# Patient Record
Sex: Male | Born: 1965 | Race: White | Hispanic: No | Marital: Married | Smoking: Never smoker
Health system: Southern US, Community
[De-identification: ages and names within clinical notes are randomized; demographics above are authoritative.]

## PROBLEM LIST (undated history)

## (undated) DIAGNOSIS — K219 Gastro-esophageal reflux disease without esophagitis: Secondary | ICD-10-CM

## (undated) DIAGNOSIS — K5790 Diverticulosis of intestine, part unspecified, without perforation or abscess without bleeding: Secondary | ICD-10-CM

## (undated) DIAGNOSIS — I10 Essential (primary) hypertension: Secondary | ICD-10-CM

## (undated) DIAGNOSIS — F329 Major depressive disorder, single episode, unspecified: Secondary | ICD-10-CM

## (undated) DIAGNOSIS — F32A Depression, unspecified: Secondary | ICD-10-CM

## (undated) HISTORY — PX: ROTATOR CUFF REPAIR: SHX139

## (undated) HISTORY — PX: COLONOSCOPY: SHX174

---
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
## 2005-04-27 ENCOUNTER — Emergency Department: Payer: Self-pay | Admitting: Unknown Physician Specialty

## 2007-02-10 ENCOUNTER — Other Ambulatory Visit: Payer: Self-pay

## 2007-02-10 ENCOUNTER — Emergency Department: Payer: Self-pay | Admitting: Emergency Medicine

## 2008-05-13 IMAGING — CR DG CHEST 2V
1 series · 2 of 2 positions shown · non-contrast
Comparison: none

REASON FOR EXAM: chest pain
COMMENTS:

PROCEDURE:     DXR - DXR CHEST PA (OR AP) AND LATERAL  - February 10, 2007  [DATE]
RESULT:     The lungs are adequately inflated and clear. The heart is normal
in size. The pulmonary vascularity is not engorged. There is no pleural
effusion. The observed portions of the thoracic spine are normal.

[Series 1: view not recorded · 0.17mm/px · 2 of 2 slices shown]
[im 1/2]
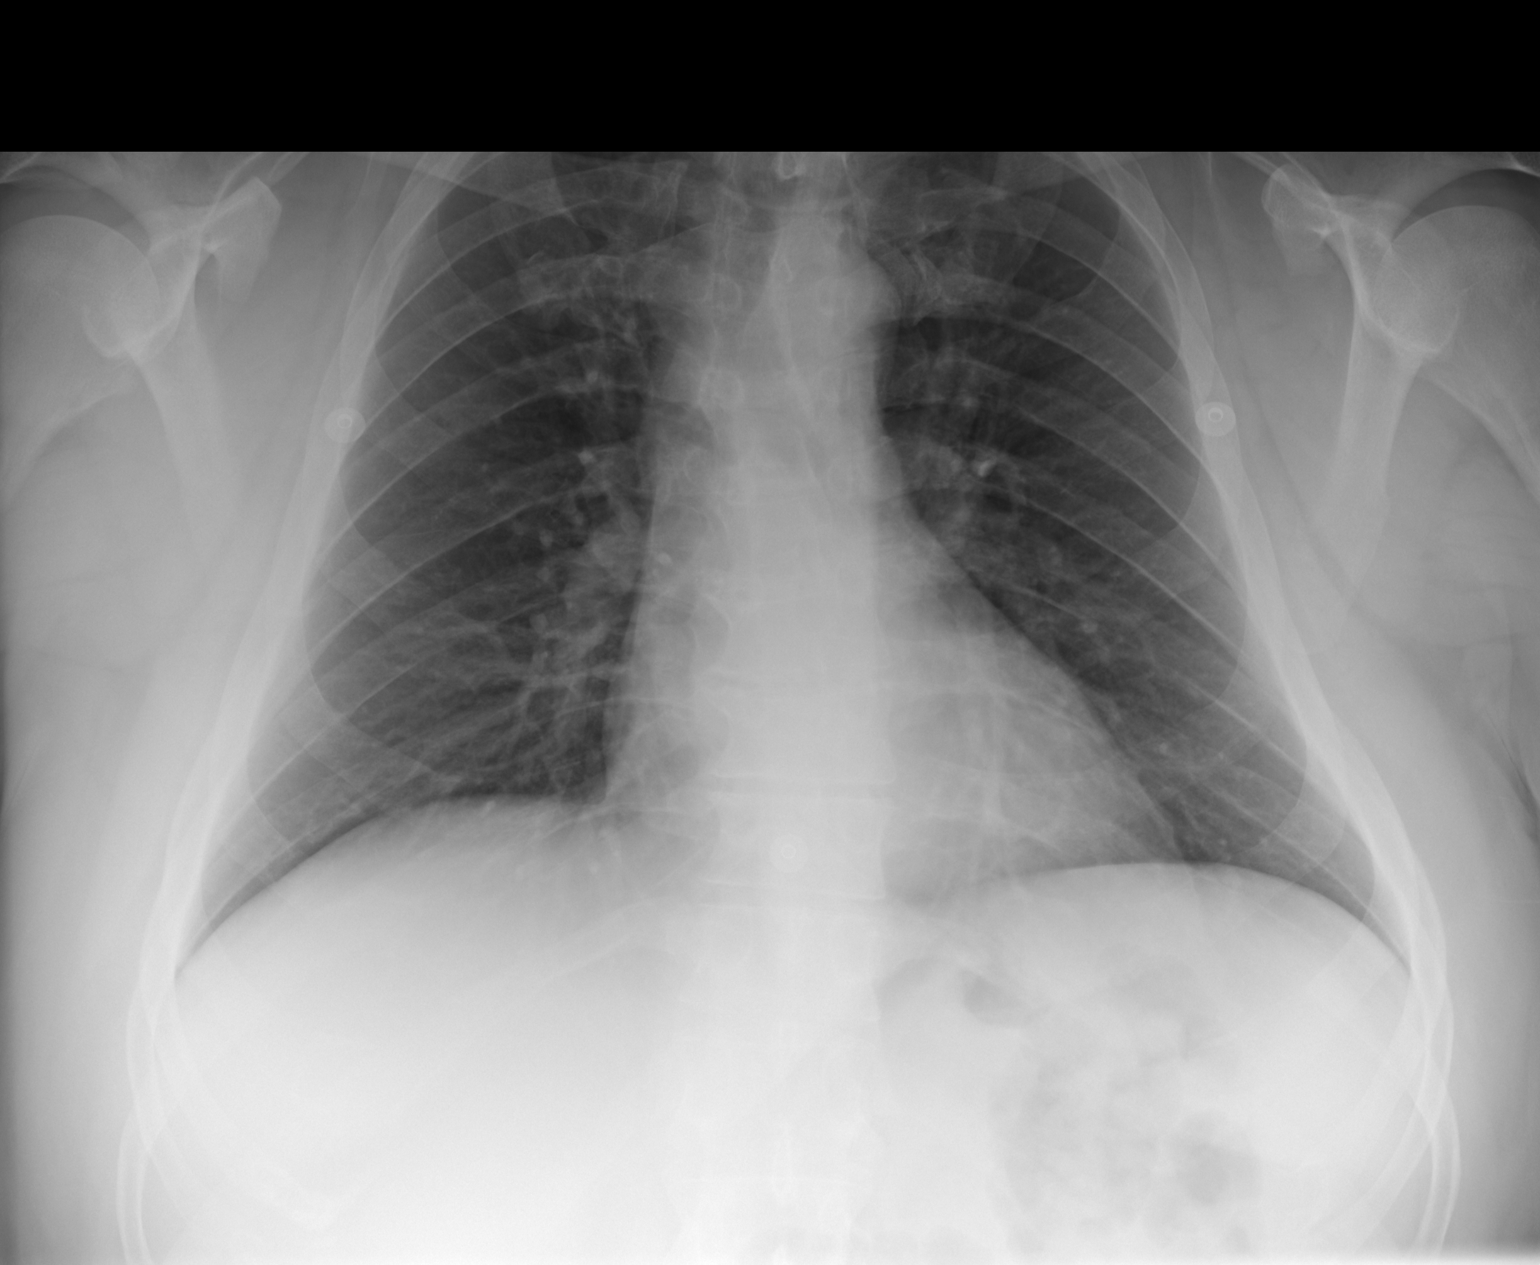
[im 2/2]
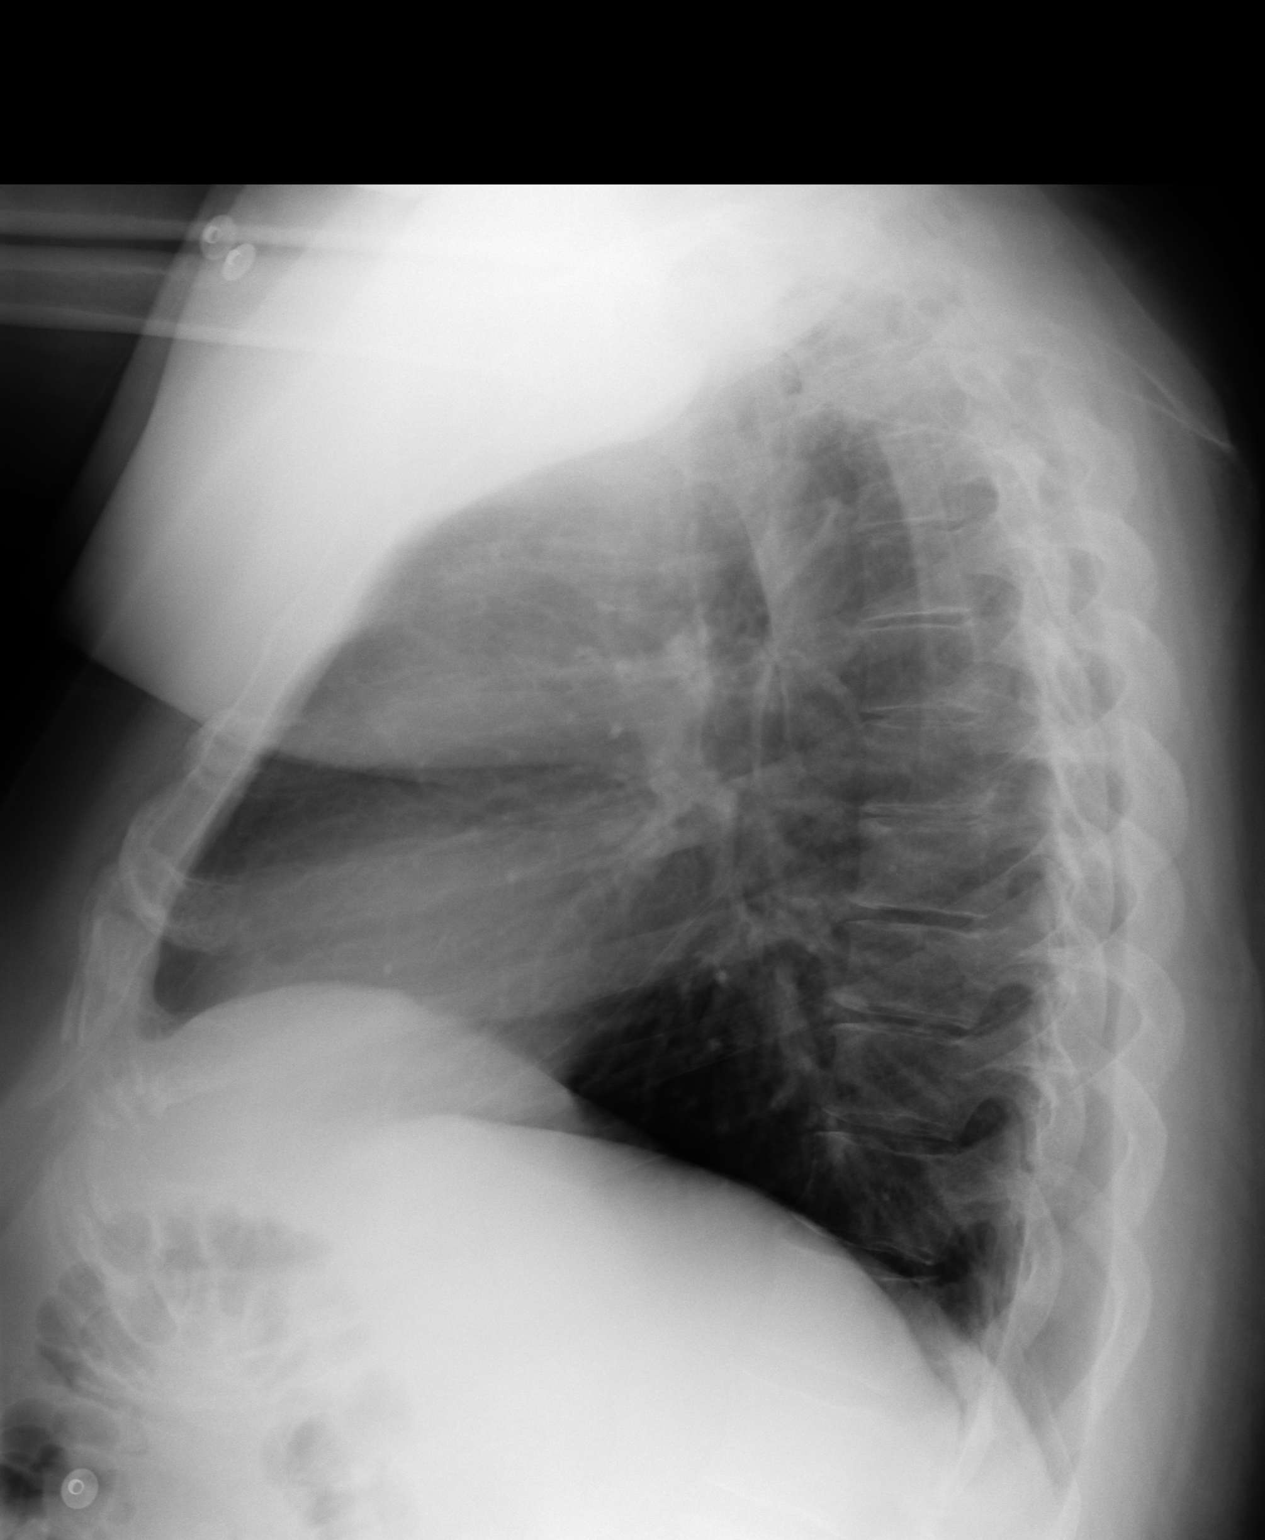

[2 of 2 positions shown; findings below may reference images not displayed]

IMPRESSION: I do not see evidence of acute cardiopulmonary disease. Followup imaging is
available if the patient's chest discomfort remains unexplained.

## 2008-09-24 ENCOUNTER — Ambulatory Visit: Payer: Self-pay | Admitting: Family Medicine

## 2009-12-26 IMAGING — RF DG BARIUM SWALLOW
1 series · 14 of 14 positions shown · non-contrast
Comparison: none

REASON FOR EXAM: hx GERD now w/regurgitation
COMMENTS:

PROCEDURE:     FL  - FL BARIUM SWALLOW  - September 24, 2008  [DATE]
RESULT:     Esophageal mucosal pattern and peristaltic activity and contour
are normal. No evidence of reflux. The cervical esophagus is normal.

[Series 1: run · 6 acquisitions, 14 frames shown]
[im 1/6]
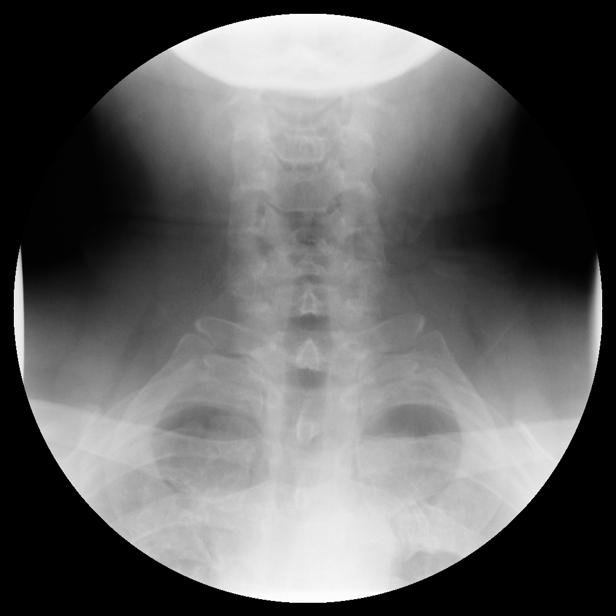
[im 1/6]
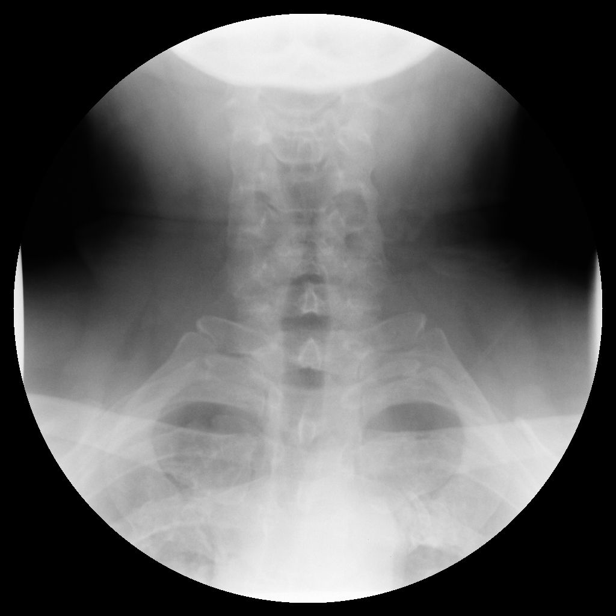
[im 1/6]
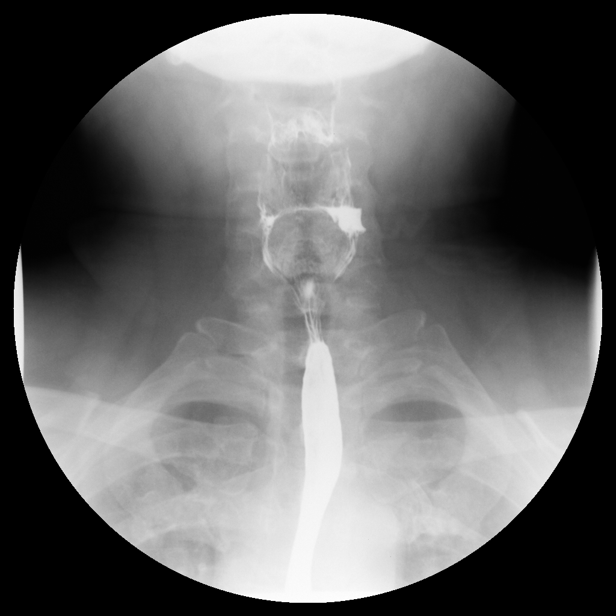
[im 1/6]
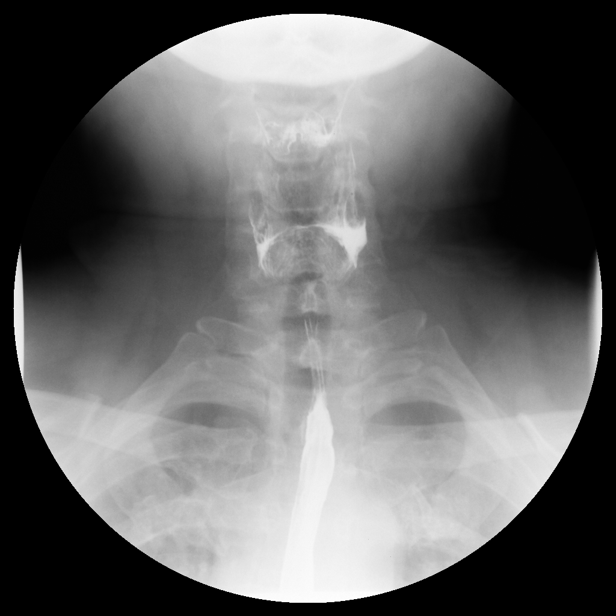
[im 2/6]
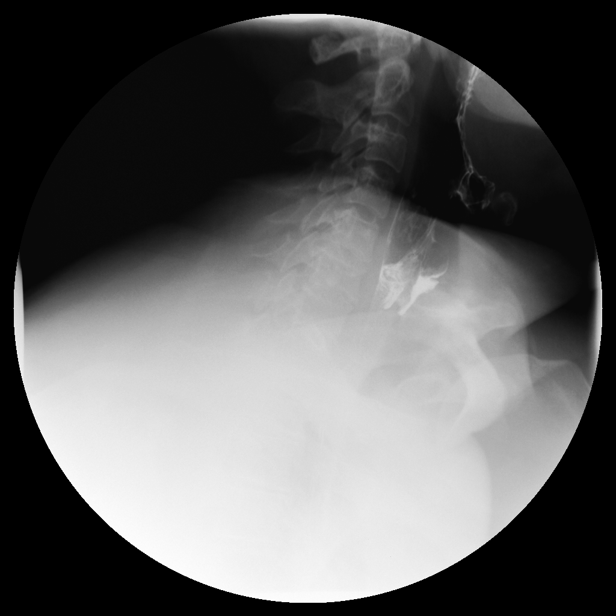
[im 2/6]
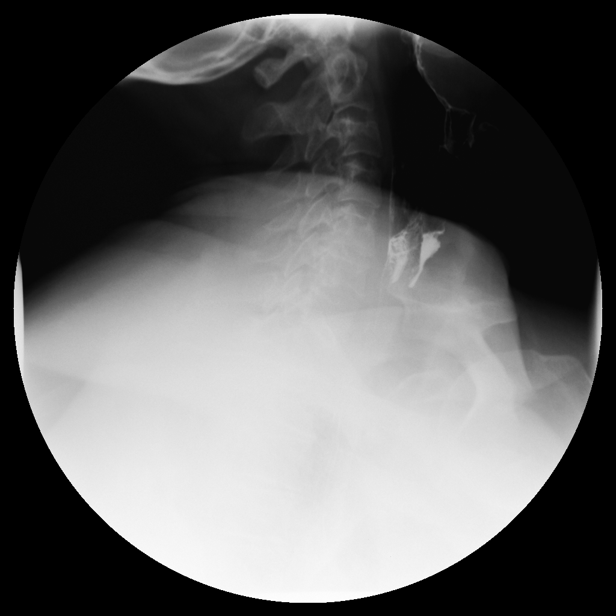
[im 2/6]
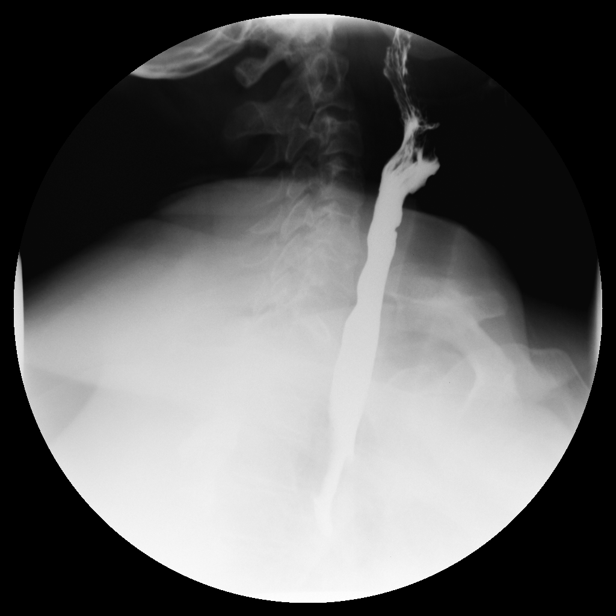
[im 2/6]
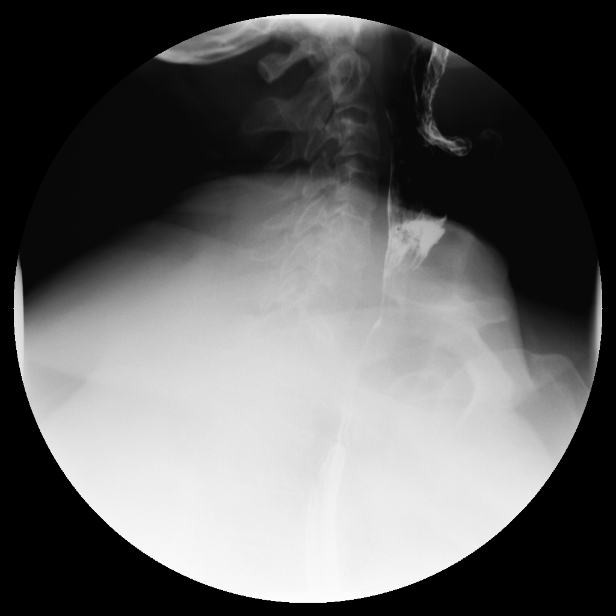
[im 3/6]
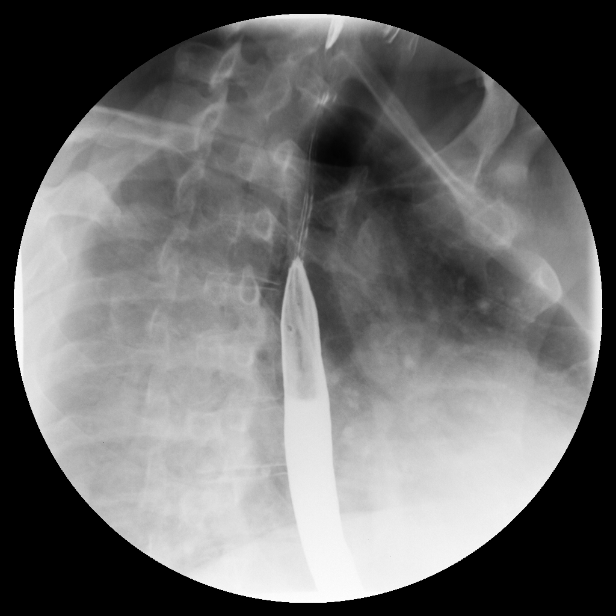
[im 3/6]
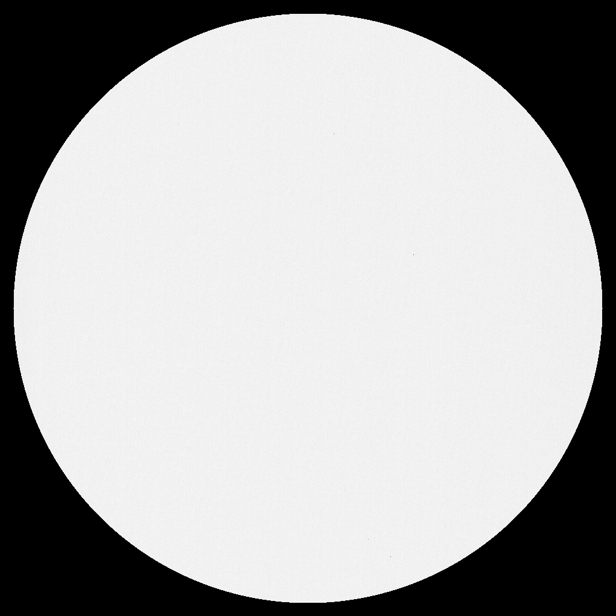
[im 4/6]
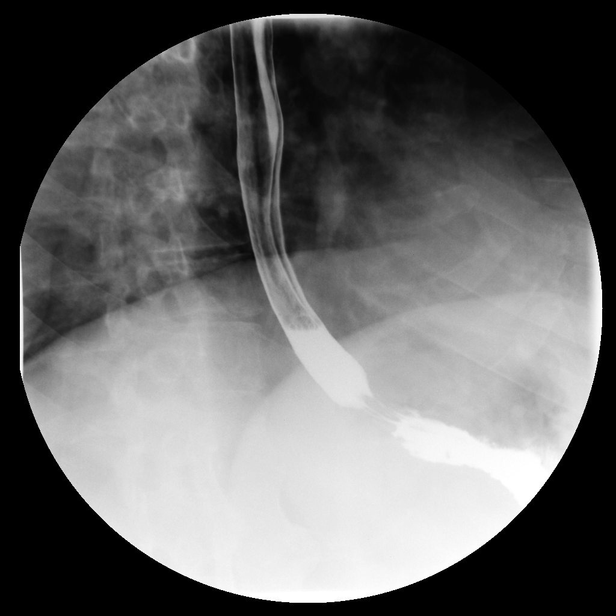
[im 5/6]
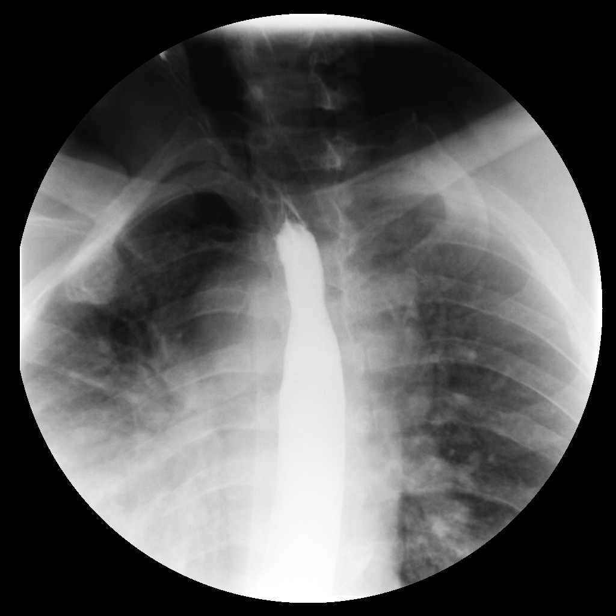
[im 6/6]
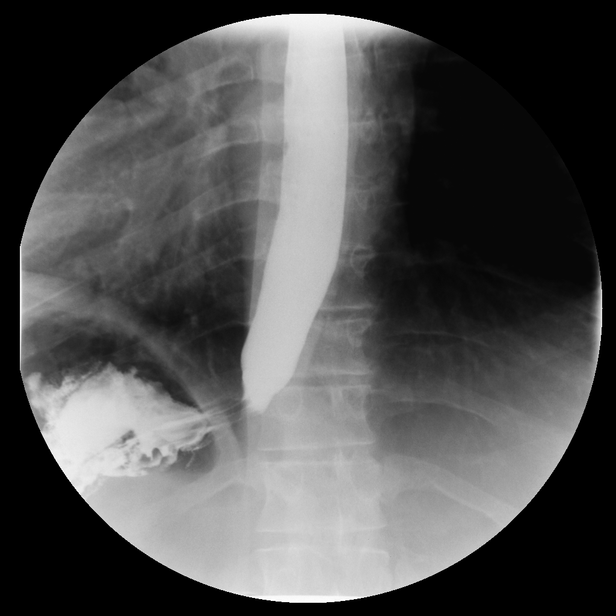
[im 6/6]
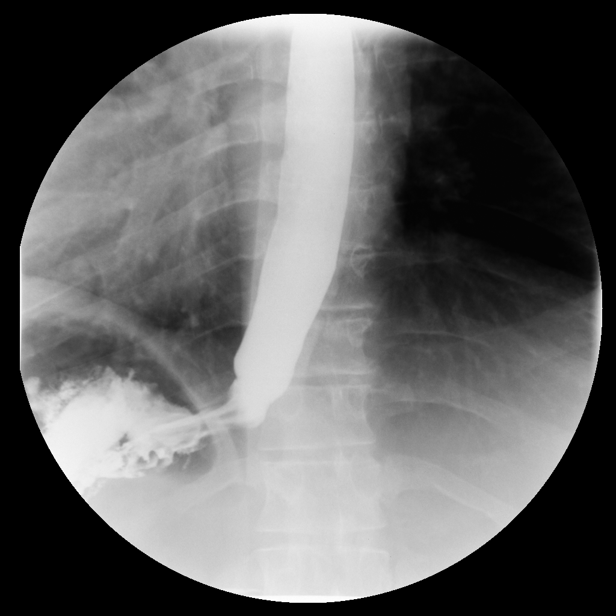

[14 of 14 positions shown; findings below may reference images not displayed]

IMPRESSION: 1. Normal exam.

## 2010-12-20 DIAGNOSIS — E669 Obesity, unspecified: Secondary | ICD-10-CM | POA: Insufficient documentation

## 2011-03-30 DIAGNOSIS — K2100 Gastro-esophageal reflux disease with esophagitis, without bleeding: Secondary | ICD-10-CM | POA: Insufficient documentation

## 2011-07-07 DIAGNOSIS — F32A Depression, unspecified: Secondary | ICD-10-CM | POA: Insufficient documentation

## 2012-10-25 ENCOUNTER — Ambulatory Visit: Payer: Self-pay | Admitting: Emergency Medicine

## 2012-10-25 LAB — RAPID STREP-A WITH REFLX: Micro Text Report: NEGATIVE

## 2012-10-27 LAB — BETA STREP CULTURE(ARMC)

## 2013-02-18 DIAGNOSIS — I10 Essential (primary) hypertension: Secondary | ICD-10-CM | POA: Insufficient documentation

## 2013-06-15 DIAGNOSIS — K5790 Diverticulosis of intestine, part unspecified, without perforation or abscess without bleeding: Secondary | ICD-10-CM | POA: Insufficient documentation

## 2013-08-24 ENCOUNTER — Ambulatory Visit: Payer: Self-pay | Admitting: Unknown Physician Specialty

## 2013-08-24 HISTORY — PX: ESOPHAGOGASTRODUODENOSCOPY: SHX1529

## 2013-09-01 LAB — PATHOLOGY REPORT

## 2014-11-10 ENCOUNTER — Ambulatory Visit
Admission: EM | Admit: 2014-11-10 | Discharge: 2014-11-10 | Disposition: A | Discharge status: Home / Self Care | Payer: Managed Care, Other (non HMO) | Attending: Family Medicine | Admitting: Family Medicine

## 2014-11-10 DIAGNOSIS — W57XXXA Bitten or stung by nonvenomous insect and other nonvenomous arthropods, initial encounter: Secondary | ICD-10-CM

## 2014-11-10 DIAGNOSIS — L02429 Furuncle of limb, unspecified: Secondary | ICD-10-CM | POA: Diagnosis not present

## 2014-11-10 DIAGNOSIS — S30861A Insect bite (nonvenomous) of abdominal wall, initial encounter: Secondary | ICD-10-CM

## 2014-11-10 MED ORDER — DOXYCYCLINE HYCLATE 100 MG PO CAPS: 100.0000 mg | ORAL_CAPSULE | Freq: Two times a day (BID) | ORAL | Status: AC

## 2014-11-10 NOTE — ED Notes (Signed)
Pt states he pulled a tick from his abdomen this morning and the head is still embedded.

## 2014-11-10 NOTE — ED Provider Notes (Signed)
Patient presents today with history of finding a tick on his lower abdomen. Patient states that he pulled the tick off but there is part of it that's remaining. He also has a red slightly tender area on his right lower extremity. He is unsure whether there was a tick there previously. He denies any fever, chills, myalgias, arthralgias, headache, nausea, vomiting. Patient states that he is outside often.   Review systems negative except mentioned above. Vitals as per chart.  GENERAL: NAD HEENT: no pharyngeal erythema, no exudate RESP: CTA B CARD: RRR SKIN: 5 mm area of circular erythema below navel, small black area in the center, minimally tender to palpation, dime sized erythematous slightly indurated area on right lateral aspect of leg, minimally tender, no discharge, no streaks NEURO: CN II-XII groslly intact   A/P: Tick bite/exposure, furuncle-splinter forceps were used to remove part of the tick that was remaining, patient tolerated procedure well, will treat patient with doxycycline for 7 days, keep area on right leg clean and dry, if any further problems patient is to follow-up with his primary care physician or our office. Encourage patient to do skin checks daily for ticks during the season.    Paulina Fusi, MD 11/10/14 [phone]

## 2015-11-15 DIAGNOSIS — N401 Enlarged prostate with lower urinary tract symptoms: Secondary | ICD-10-CM | POA: Insufficient documentation

## 2015-11-15 DIAGNOSIS — R972 Elevated prostate specific antigen [PSA]: Secondary | ICD-10-CM | POA: Insufficient documentation

## 2015-11-15 DIAGNOSIS — Z87442 Personal history of urinary calculi: Secondary | ICD-10-CM | POA: Insufficient documentation

## 2015-11-15 DIAGNOSIS — N138 Other obstructive and reflux uropathy: Secondary | ICD-10-CM | POA: Insufficient documentation

## 2015-12-21 DIAGNOSIS — B3789 Other sites of candidiasis: Secondary | ICD-10-CM | POA: Insufficient documentation

## 2017-05-14 DIAGNOSIS — K2950 Unspecified chronic gastritis without bleeding: Secondary | ICD-10-CM | POA: Insufficient documentation

## 2018-12-24 DIAGNOSIS — K760 Fatty (change of) liver, not elsewhere classified: Secondary | ICD-10-CM | POA: Insufficient documentation

## 2019-02-18 ENCOUNTER — Other Ambulatory Visit: Payer: Self-pay

## 2019-02-18 ENCOUNTER — Encounter
Admission: RE | Admit: 2019-02-18 | Discharge: 2019-02-18 | Disposition: A | Discharge status: Home / Self Care | Payer: Managed Care, Other (non HMO) | Source: Ambulatory Visit | Attending: Gastroenterology | Admitting: Gastroenterology

## 2019-02-18 DIAGNOSIS — Z01812 Encounter for preprocedural laboratory examination: Secondary | ICD-10-CM | POA: Diagnosis not present

## 2019-02-18 DIAGNOSIS — Z20828 Contact with and (suspected) exposure to other viral communicable diseases: Secondary | ICD-10-CM | POA: Diagnosis not present

## 2019-02-18 LAB — SARS CORONAVIRUS 2 (TAT 6-24 HRS): SARS Coronavirus 2: NEGATIVE

## 2019-02-22 ENCOUNTER — Encounter: Payer: Self-pay | Admitting: Anesthesiology

## 2019-02-23 ENCOUNTER — Encounter
Admission: RE | Disposition: A | Discharge status: Home / Self Care | Payer: Self-pay | Source: Home / Self Care | Attending: Gastroenterology

## 2019-02-23 ENCOUNTER — Encounter: Payer: Self-pay | Admitting: *Deleted

## 2019-02-23 ENCOUNTER — Other Ambulatory Visit: Payer: Self-pay

## 2019-02-23 ENCOUNTER — Ambulatory Visit: Payer: Managed Care, Other (non HMO) | Admitting: Anesthesiology

## 2019-02-23 ENCOUNTER — Ambulatory Visit
Admission: RE | Admit: 2019-02-23 | Discharge: 2019-02-23 | Disposition: A | Discharge status: Home / Self Care | Payer: Managed Care, Other (non HMO) | Attending: Gastroenterology | Admitting: Gastroenterology

## 2019-02-23 DIAGNOSIS — Z8719 Personal history of other diseases of the digestive system: Secondary | ICD-10-CM | POA: Diagnosis not present

## 2019-02-23 DIAGNOSIS — Z79899 Other long term (current) drug therapy: Secondary | ICD-10-CM | POA: Insufficient documentation

## 2019-02-23 DIAGNOSIS — F329 Major depressive disorder, single episode, unspecified: Secondary | ICD-10-CM | POA: Diagnosis not present

## 2019-02-23 DIAGNOSIS — I10 Essential (primary) hypertension: Secondary | ICD-10-CM | POA: Insufficient documentation

## 2019-02-23 DIAGNOSIS — Z6839 Body mass index (BMI) 39.0-39.9, adult: Secondary | ICD-10-CM | POA: Diagnosis not present

## 2019-02-23 DIAGNOSIS — Z800 Family history of malignant neoplasm of digestive organs: Secondary | ICD-10-CM | POA: Insufficient documentation

## 2019-02-23 DIAGNOSIS — Z881 Allergy status to other antibiotic agents status: Secondary | ICD-10-CM | POA: Insufficient documentation

## 2019-02-23 DIAGNOSIS — K5730 Diverticulosis of large intestine without perforation or abscess without bleeding: Secondary | ICD-10-CM | POA: Diagnosis not present

## 2019-02-23 DIAGNOSIS — D125 Benign neoplasm of sigmoid colon: Secondary | ICD-10-CM | POA: Insufficient documentation

## 2019-02-23 HISTORY — DX: Gastro-esophageal reflux disease without esophagitis: K21.9

## 2019-02-23 HISTORY — DX: Diverticulosis of intestine, part unspecified, without perforation or abscess without bleeding: K57.90

## 2019-02-23 HISTORY — DX: Essential (primary) hypertension: I10

## 2019-02-23 HISTORY — DX: Depression, unspecified: F32.A

## 2019-02-23 HISTORY — PX: COLONOSCOPY WITH PROPOFOL: SHX5780

## 2019-02-23 SURGERY — COLONOSCOPY WITH PROPOFOL
Anesthesia: General

## 2019-02-23 MED ORDER — LIDOCAINE HCL (PF) 2 % IJ SOLN
INTRAMUSCULAR | Status: DC | PRN
  Administered 2019-02-23: 100 mg

## 2019-02-23 MED ORDER — FENTANYL CITRATE (PF) 100 MCG/2ML IJ SOLN
INTRAMUSCULAR | Status: AC
  Filled 2019-02-23: qty 2

## 2019-02-23 MED ORDER — PROPOFOL 500 MG/50ML IV EMUL
INTRAVENOUS | Status: DC | PRN
  Administered 2019-02-23: 50 ug/kg/min via INTRAVENOUS

## 2019-02-23 MED ORDER — MIDAZOLAM HCL 5 MG/5ML IJ SOLN
INTRAMUSCULAR | Status: DC | PRN
  Administered 2019-02-23: 2 mg via INTRAVENOUS

## 2019-02-23 MED ORDER — LIDOCAINE HCL (PF) 2 % IJ SOLN
INTRAMUSCULAR | Status: AC
  Filled 2019-02-23: qty 10

## 2019-02-23 MED ORDER — MIDAZOLAM HCL 2 MG/2ML IJ SOLN
INTRAMUSCULAR | Status: AC
  Filled 2019-02-23: qty 2

## 2019-02-23 MED ORDER — PROPOFOL 500 MG/50ML IV EMUL
INTRAVENOUS | Status: AC
  Filled 2019-02-23: qty 50

## 2019-02-23 MED ORDER — FENTANYL CITRATE (PF) 100 MCG/2ML IJ SOLN
INTRAMUSCULAR | Status: DC | PRN
  Administered 2019-02-23 (×2): 50 ug via INTRAVENOUS

## 2019-02-23 MED ORDER — PROPOFOL 10 MG/ML IV BOLUS
INTRAVENOUS | Status: DC | PRN
  Administered 2019-02-23: 20 mg via INTRAVENOUS
  Administered 2019-02-23: 50 mg via INTRAVENOUS

## 2019-02-23 MED ORDER — SODIUM CHLORIDE 0.9 % IV SOLN
INTRAVENOUS | Status: DC
  Administered 2019-02-23: 11:00:00 via INTRAVENOUS

## 2019-02-23 NOTE — Transfer of Care (Signed)
Immediate Anesthesia Transfer of Care Note  Patient: Martin Johnson  Procedure(s) Performed: COLONOSCOPY WITH PROPOFOL (N/A )  Patient Location: PACU  Anesthesia Type:General  Level of Consciousness: sedated  Airway & Oxygen Therapy: Patient Spontanous Breathing and Patient connected to nasal cannula oxygen  Post-op Assessment: Report given to RN and Post -op Vital signs reviewed and stable  Post vital signs: Reviewed and stable  Last Vitals:  Vitals Value Taken Time  BP 134/86 02/23/19 1118  Temp 36.1 C 02/23/19 1119  Pulse 68 02/23/19 1119  Resp 16 02/23/19 1119  SpO2 99 % 02/23/19 1119  Vitals shown include unvalidated device data.  Last Pain:  Vitals:   02/23/19 1119  TempSrc:   PainSc: 0-No pain         Complications: No apparent anesthesia complications

## 2019-02-23 NOTE — Op Note (Signed)
[facility] Gastroenterology Patient Name: Martin Johnson Procedure Date: 02/23/2019 10:34 AM MRN: [id number] Account #: [id number] Date of Birth: 23-Apr-1966 Admit Type: Outpatient Age: 53 Room: [facility] ENDO ROOM 1 Gender: Male Note Status: Finalized Procedure:            Colonoscopy Indications:          Family history of colon cancer in a first-degree                        relative before age 33 years Providers:            Lollie Sails, MD Medicines:            Monitored Anesthesia Care Complications:        No immediate complications. Procedure:            Pre-Anesthesia Assessment:                       - ASA Grade Assessment: III - A patient with severe                        systemic disease.                       After obtaining informed consent, the colonoscope was                        passed under direct vision. Throughout the procedure,                        the patient's blood pressure, pulse, and oxygen                        saturations were monitored continuously. The                        Colonoscope was introduced through the anus and                        advanced to the the cecum, identified by appendiceal                        orifice and ileocecal valve. The colonoscopy was                        performed without difficulty. The patient tolerated the                        procedure well. The quality of the bowel preparation                        was fair. Findings:      A 3 mm polyp was found in the mid sigmoid colon. The polyp was sessile.       The polyp was removed with a cold snare. Resection and retrieval were       complete.      Many small and large-mouthed diverticula were found in the sigmoid       colon, descending colon, transverse colon and ascending colon.      The retroflexed view of the distal rectum and anal verge was normal and  showed no anal or rectal abnormalities.      The digital rectal exam was  normal. Impression:           - Preparation of the colon was fair.                       - One 3 mm polyp in the mid sigmoid colon, removed with                        a cold snare. Resected and retrieved.                       - Diverticulosis in the sigmoid colon, in the                        descending colon, in the transverse colon and in the                        ascending colon.                       - The distal rectum and anal verge are normal on                        retroflexion view. Recommendation:       - Discharge patient to home.                       - Soft diet today, then advance as tolerated to advance                        diet as tolerated. Procedure Code(s):    --- Professional ---                       [phone], Colonoscopy, flexible; with removal of tumor(s),                        polyp(s), or other lesion(s) by snare technique Diagnosis Code(s):    --- Professional ---                       K63.5, Polyp of colon                       Z80.0, Family history of malignant neoplasm of                        digestive organs                       K57.30, Diverticulosis of large intestine without                        perforation or abscess without bleeding CPT copyright 2019 American Medical Association. All rights reserved. The codes documented in this report are preliminary and upon coder review may  be revised to meet current compliance requirements. Lollie Sails, MD 02/23/2019 11:17:39 AM This report has been signed electronically. Number of Addenda: 0 Note Initiated On: 02/23/2019 10:34 AM Scope Withdrawal Time: 0 hours 12 minutes 42 seconds  Total Procedure Duration: 0 hours 21 minutes 41 seconds  [facility]

## 2019-02-23 NOTE — H&P (Signed)
Outpatient short stay form Pre-procedure 02/23/2019 10:43 AM Martin Sails MD  Primary Physician: Silvio Pate, PA  Reason for visit: Colonoscopy  History of present illness: Patient is a 53 year old male presenting today for colonoscopy in regards to his family history of colon cancer primary relative, father.  He was also originally scheduled for EGD however has declined that procedure today.  Does have a history of diverticulitis as well.  His last EGD and colonoscopy were on 08/24/2013 showing diverticulosis and hyperplastic polyp in the colon and some grade a esophagitis likely reflux related.  Patient denies any upper GI symptoms including heartburn or dysphagia.  Tolerated his prep well.  He takes no aspirin or blood thinning agent.    Current Facility-Administered Medications:  .  0.9 %  sodium chloride infusion, , Intravenous, Continuous, Martin Sails, MD, Last Rate: 20 mL/hr at 02/23/19 1039  Medications Prior to Admission  Medication Sig Dispense Refill Last Dose  . citalopram (CELEXA) 20 MG tablet Take 20 mg by mouth daily.   02/21/2019 at 2100  . fluticasone (FLONASE) 50 MCG/ACT nasal spray Place into both nostrils 2 (two) times daily as needed for allergies or rhinitis.   02/21/2019 at 2100     Allergies  Allergen Reactions  . Azithromycin Diarrhea     Past Medical History:  Diagnosis Date  . Depression   . Diverticulosis   . GERD (gastroesophageal reflux disease)   . Hypertension     Review of systems:      Physical Exam    Heart and lungs: Regular rate and rhythm without rub or gallop lungs are bilaterally clear    HEENT: Normocephalic atraumatic eyes are anicteric    Other:    Pertinant exam for procedure: Soft nontender nondistended bowel sounds positive normoactive colonoscopy and indicated procedures.    Planned proceedures: Colonoscopy and indicated procedures. I have discussed the risks benefits and complications of procedures to include  not limited to bleeding, infection, perforation and the risk of sedation and the patient wishes to proceed.    Martin Sails, MD Gastroenterology 02/23/2019  10:43 AM

## 2019-02-23 NOTE — Anesthesia Postprocedure Evaluation (Signed)
Anesthesia Post Note  Patient: Martin Johnson  Procedure(s) Performed: COLONOSCOPY WITH PROPOFOL (N/A )  Patient location during evaluation: Endoscopy Anesthesia Type: General Level of consciousness: awake and alert Pain management: pain level controlled Vital Signs Assessment: post-procedure vital signs reviewed and stable Respiratory status: spontaneous breathing, nonlabored ventilation, respiratory function stable and patient connected to nasal cannula oxygen Cardiovascular status: blood pressure returned to baseline and stable Postop Assessment: no apparent nausea or vomiting Anesthetic complications: no     Last Vitals:  Vitals:   02/23/19 1126 02/23/19 1129  BP:  (!) 137/91  Pulse: 72   Resp: 20   Temp:    SpO2: 100%     Last Pain:  Vitals:   02/23/19 1135  TempSrc:   PainSc: 0-No pain                 Jeffery Bachmeier S

## 2019-02-23 NOTE — Anesthesia Preprocedure Evaluation (Signed)
Anesthesia Evaluation  Patient identified by MRN, date of birth, ID band Patient awake    Reviewed: Allergy & Precautions, NPO status , Patient's Chart, lab work & pertinent test results, reviewed documented beta blocker date and time   Airway Mallampati: III  TM Distance: >3 FB     Dental  (+) Chipped   Pulmonary           Cardiovascular      Neuro/Psych    GI/Hepatic   Endo/Other  Morbid obesity  Renal/GU      Musculoskeletal   Abdominal   Peds  Hematology   Anesthesia Other Findings   Reproductive/Obstetrics                             Anesthesia Physical Anesthesia Plan  ASA: III  Anesthesia Plan: General   Post-op Pain Management:    Induction: Intravenous  PONV Risk Score and Plan:   Airway Management Planned:   Additional Equipment:   Intra-op Plan:   Post-operative Plan:   Informed Consent: I have reviewed the patients History and Physical, chart, labs and discussed the procedure including the risks, benefits and alternatives for the proposed anesthesia with the patient or authorized representative who has indicated his/her understanding and acceptance.       Plan Discussed with: CRNA  Anesthesia Plan Comments:         Anesthesia Quick Evaluation

## 2019-02-23 NOTE — Anesthesia Post-op Follow-up Note (Signed)
Anesthesia QCDR form completed.        

## 2019-02-24 ENCOUNTER — Encounter: Payer: Self-pay | Admitting: Gastroenterology

## 2019-02-24 LAB — SURGICAL PATHOLOGY

## 2019-08-09 DIAGNOSIS — G4733 Obstructive sleep apnea (adult) (pediatric): Secondary | ICD-10-CM | POA: Insufficient documentation

## 2020-07-24 NOTE — Progress Notes (Signed)
Cardiology Office Note  Date:  07/26/2020   ID:  Taggert, Bozzi November 12, 1965, MRN [id number]  PCP:  Midge Minium, PA   Chief Complaint  Patient presents with  . New Patient (Initial Visit)    Establish care with provider for chest pain and BBB. Medications verbally reviewed with patient.     HPI:  Mr. Martin Johnson is a 55 year old gentleman with past medical history of Severe OSA, uses CPAP HTN Diverticulitis atypical chest pain Seen in the ER for chest pain 07/10/2020,  Who presents by referral  from Monico Hoar for consultation of his chest pain  Seen in the emergency room July 10, 2020 at [facility] 2 days of intermittent, moderate parasternal chest pain associated with right arm coldness/tingling sensation. Patient reports nothing like this is ever happened before. No significant leg edema. No dyspnea. Patient not having chest pain upon time of initial evaluation. Patient reports no history of DVT/PE, no cancer history, no smoking, no recent surgery/immobilization.  Atypical pain, off and on, Hurts to touch it BP elevated in ER, started on low dose benicar  Does not check BP at home  Tingling hand/arm on right, Works on machinery  RBBB, seen before  CPAP mask, does not fit well, leaks during the night  EKG personally reviewed by myself on todays visit nsr rate 83 RBBB  Total chol 183, LDL 118   PMH:   has a past medical history of Depression, Diverticulosis, GERD (gastroesophageal reflux disease), and Hypertension.  PSH:    Past Surgical History:  Procedure Laterality Date  . COLONOSCOPY  08/24/2013, 12/02/2009   hyperplastic polyps ub rectum  . COLONOSCOPY WITH PROPOFOL N/A 02/23/2019   Procedure: COLONOSCOPY WITH PROPOFOL;  Surgeon: Lollie Sails, MD;  Location: [facility] ENDOSCOPY;  Service: Endoscopy;  Laterality: N/A;  . ESOPHAGOGASTRODUODENOSCOPY  08/24/2013   reflus gastroerophagistis, chronic gastritis in the body and antrum & single colon  polyp in antrum.    Current Outpatient Medications  Medication Sig Dispense Refill  . fluticasone (FLONASE) 50 MCG/ACT nasal spray Place into both nostrils 2 (two) times daily as needed for allergies or rhinitis.    Marland Kitchen olmesartan (BENICAR) 5 MG tablet Take 1 tablet by mouth daily.    Marland Kitchen omeprazole (PRILOSEC) 40 MG capsule Take 1 tablet by mouth daily.     No current facility-administered medications for this visit.     Allergies:   Azithromycin   Social History:  The patient  reports that he has never smoked. He has never used smokeless tobacco. He reports that he does not drink alcohol and does not use drugs.   Family History:   family history includes Cancer in his mother; Colon cancer in his father.    Review of Systems: Review of Systems  Constitutional: Negative.   HENT: Negative.   Respiratory: Negative.   Cardiovascular: Negative.        Pain on pushing on right chest  Gastrointestinal: Negative.   Musculoskeletal: Negative.   Neurological: Negative.   Psychiatric/Behavioral: Negative.   All other systems reviewed and are negative.   PHYSICAL EXAM: VS:  BP 140/90 (BP Location: Right Arm, Patient Position: Sitting, Cuff Size: Normal)   Pulse 83   Ht 5\' 8"  (1.727 m)   Wt 278 lb (126.1 kg)   SpO2 97%   BMI 42.27 kg/m  , BMI Body mass index is 42.27 kg/m. GEN: Well nourished, well developed, in no acute distress HEENT: normal Neck: no JVD, carotid bruits, or masses Cardiac:  RRR; no murmurs, rubs, or gallops,no edema  Respiratory:  clear to auscultation bilaterally, normal work of breathing GI: soft, nontender, nondistended, + BS MS: no deformity or atrophy Skin: warm and dry, no rash Neuro:  Strength and sensation are intact Psych: euthymic mood, full affect   Recent Labs: No results found for requested labs within last 8760 hours.    Lipid Panel No results found for: CHOL, HDL, LDLCALC, TRIG    Wt Readings from Last 3 Encounters:  07/26/20 278 lb  (126.1 kg)  02/23/19 260 lb (117.9 kg)  11/10/14 250 lb (113.4 kg)      ASSESSMENT AND PLAN:  Problem List Items Addressed This Visit   None   Visit Diagnoses    Chest pain of uncertain etiology    -  Primary   Obstructive sleep apnea         Atypical chest pain Musculoskeletal Rib issue No further cardiac work needed CT coronary calcium score ordered  Morbid obesity We have encouraged continued exercise, careful diet management in an effort to lose weight.  OSA on CPAP Mask does not seal well  HTN Blood pressure is well controlled on today's visit. No changes made to the medications.  RBBB: No further workup needed    Total encounter time more than 60 minutes  Greater than 50% was spent in counseling and coordination of care with the patient  Patient seen in consultation for Silvio Pate and will be referred back to her office for ongoing care of the issues detailed above    Signed, Esmond Plants, M.D., Ph.D. [place], [place]

## 2020-07-26 ENCOUNTER — Other Ambulatory Visit: Payer: Self-pay

## 2020-07-26 ENCOUNTER — Encounter: Payer: Self-pay | Admitting: Cardiovascular Disease

## 2020-07-26 ENCOUNTER — Ambulatory Visit: Payer: Managed Care, Other (non HMO) | Admitting: Cardiovascular Disease

## 2020-07-26 VITALS — BP 140/90 | HR 83 | Ht 68.0 in | Wt 278.0 lb

## 2020-07-26 DIAGNOSIS — Z8249 Family history of ischemic heart disease and other diseases of the circulatory system: Secondary | ICD-10-CM

## 2020-07-26 DIAGNOSIS — R079 Chest pain, unspecified: Secondary | ICD-10-CM | POA: Diagnosis not present

## 2020-07-26 DIAGNOSIS — G4733 Obstructive sleep apnea (adult) (pediatric): Secondary | ICD-10-CM | POA: Diagnosis not present

## 2020-07-26 NOTE — Patient Instructions (Addendum)
Medication Instructions:  No changes  Lab work: No new labs needed  Testing/Procedures: CT coronary calcium score: (family history)  $99 out of pocket expense  at our [employer] in [place]  This procedure uses special x-ray equipment to produce pictures of the coronary arteries to determine if they are blocked or narrowed by the buildup of plaque - an indicator for atherosclerosis or coronary artery disease (CAD).  Please call [phone] to schedule at your earliest convince   [place] [address] [place], [place] [address]   Follow-Up:  . You will need a follow up appointment as needed  . Providers on your designated Care Team:   . Murray Hodgkins, NP . Christell Faith, PA-C . Marrianne Mood, PA-C .

## 2020-08-05 ENCOUNTER — Ambulatory Visit
Admission: RE | Admit: 2020-08-05 | Discharge: 2020-08-05 | Disposition: A | Discharge status: Home / Self Care | Payer: Managed Care, Other (non HMO) | Source: Ambulatory Visit | Attending: Cardiovascular Disease | Admitting: Cardiovascular Disease

## 2020-08-05 ENCOUNTER — Other Ambulatory Visit: Payer: Self-pay

## 2020-08-05 DIAGNOSIS — R079 Chest pain, unspecified: Secondary | ICD-10-CM | POA: Insufficient documentation

## 2020-08-05 DIAGNOSIS — Z8249 Family history of ischemic heart disease and other diseases of the circulatory system: Secondary | ICD-10-CM | POA: Insufficient documentation

## 2020-09-20 ENCOUNTER — Telehealth: Payer: Self-pay | Admitting: Cardiovascular Disease

## 2020-09-20 NOTE — Telephone Encounter (Signed)
Pt c/o BP issue: STAT if pt c/o blurred vision, one-sided weakness or slurred speech  1. What are your last 5 BP readings? 170/108 manually  And w. Machine 141/101.  2. Are you having any other symptoms (ex. Dizziness, headache, blurred vision, passed out)? No  3. What is your BP issue? High and raised concern at company health check today

## 2020-09-20 NOTE — Telephone Encounter (Signed)
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 Martin Johnson reports he had been up working piror to BP readings and did take his Benicar 5 mg this morning. Denies any associated symptoms, reports he feels fine, no CP, shob, or dizziness.  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 If still remains elevated, then to call back for possible appt to re-evaluate BP and possible medication changes. Pt verbalized understanding, also advised to call back if HTN and starts to have associated cardiac symptoms. Pt verbalized understanding, grateful for the return call, will monitor BP and call back with any further concerns.

## 2022-06-25 ENCOUNTER — Ambulatory Visit: Payer: Managed Care, Other (non HMO)

## 2022-06-25 ENCOUNTER — Ambulatory Visit: Payer: Managed Care, Other (non HMO) | Attending: Medical | Admitting: Medical

## 2022-06-25 ENCOUNTER — Encounter: Payer: Self-pay | Admitting: Medical

## 2022-06-25 ENCOUNTER — Other Ambulatory Visit
Admission: RE | Admit: 2022-06-25 | Discharge: 2022-06-25 | Disposition: A | Discharge status: Home / Self Care | Payer: Managed Care, Other (non HMO) | Source: Ambulatory Visit | Attending: Medical | Admitting: Medical

## 2022-06-25 VITALS — BP 128/90 | HR 98 | Wt 288.8 lb

## 2022-06-25 DIAGNOSIS — G4733 Obstructive sleep apnea (adult) (pediatric): Secondary | ICD-10-CM

## 2022-06-25 DIAGNOSIS — I10 Essential (primary) hypertension: Secondary | ICD-10-CM

## 2022-06-25 DIAGNOSIS — R002 Palpitations: Secondary | ICD-10-CM

## 2022-06-25 DIAGNOSIS — Z01810 Encounter for preprocedural cardiovascular examination: Secondary | ICD-10-CM

## 2022-06-25 DIAGNOSIS — R079 Chest pain, unspecified: Secondary | ICD-10-CM

## 2022-06-25 LAB — TSH: TSH: 1.244 u[IU]/mL (ref 0.350–4.500)

## 2022-06-25 NOTE — Progress Notes (Signed)
Cardiology Office Note:    Date:  06/25/2022   ID:  Martin Johnson, DOB Mar 15, 1966, MRN [id number]  PCP:  Midge Minium, [facility] Cardiologist:  None  [facility] Electrophysiologist:  None   Referring MD: Midge Minium, PA   Chief Complaint: 1 year follow-up/pre-op  History of Present Illness:    Martin Johnson is a 57 y.o. male with a hx of severe OSA, CPAP, HTN, diverticulitis, atypical chest pain who presents for 1 year follow-up.   The patient was seen in the ER at [facility] 07/2020 for chest pain. Work-up showed elevated BP and he was started on Benicar. Cardiac CT was ordered, which showed coronary calcium score of 0.   Today, the patient reports last Thursday he went to th ER for for possible kidney stone. He was place don antibiotic for UTI. He had a virtual appointment and thought he possible had a flu and medicine was sent in. He felt his heart rate was high and was sweating. IT didn't last too long. BP 150/70 and HR 98bpm. Sunday he felt the same way and checked his vitals, 176/104 pulse 89bpm. He didn't feel lightheaded or sweaty He went back to the ER and was diagnosed with Flu B.He was given IVF. He was told he may have an abnormal heart beat.   From the flue he fee tired weak, some SOB. He had minimal chest pain. He had a lot of acid-reflux the last couple days.   He was supposed to go for rotator cuff surgery, postoned until February.   Past Medical History:  Diagnosis Date   Depression    Diverticulosis    GERD (gastroesophageal reflux disease)    Hypertension     Past Surgical History:  Procedure Laterality Date   COLONOSCOPY  08/24/2013, 12/02/2009   hyperplastic polyps ub rectum   COLONOSCOPY WITH PROPOFOL N/A 02/23/2019   Procedure: COLONOSCOPY WITH PROPOFOL;  Surgeon: Lollie Sails, MD;  Location: [facility] ENDOSCOPY;  Service: Endoscopy;  Laterality: N/A;   ESOPHAGOGASTRODUODENOSCOPY  08/24/2013   reflus gastroerophagistis, chronic  gastritis in the body and antrum & single colon polyp in antrum.    Current Medications: Current Meds  Medication Sig   cefUROXime (CEFTIN) 500 MG tablet Take 500 mg by mouth 2 (two) times daily with a meal.   fluticasone (FLONASE) 50 MCG/ACT nasal spray Place into both nostrils every morning.   olmesartan (BENICAR) 5 MG tablet Take 1 tablet by mouth daily.   omeprazole (PRILOSEC) 40 MG capsule Take 1 tablet by mouth daily.   rosuvastatin (CRESTOR) 5 MG tablet Take 1 tablet by mouth daily.     Allergies:   Azithromycin   Social History   Socioeconomic History   Marital status: Married    Spouse name: Not on file   Number of children: Not on file   Years of education: Not on file   Highest education level: Not on file  Occupational History   Not on file  Tobacco Use   Smoking status: Never   Smokeless tobacco: Never  Substance and Sexual Activity   Alcohol use: No   Drug use: No   Sexual activity: Yes  Other Topics Concern   Not on file  Social History Narrative   Not on file   Social Determinants of Health   Financial Resource Strain: Not on file  Food Insecurity: Not on file  Transportation Needs: Not on file  Physical Activity: Not on file  Stress: Not  on file  Social Connections: Not on file     Family History: The patient's family history includes Cancer in his mother; Colon cancer in his father.  ROS:   Please see the history of present illness.     All other systems reviewed and are negative.  EKGs/Labs/Other Studies Reviewed:    The following studies were reviewed today:  Cardiac CTA 08/2020 IMPRESSION: Normal coronary calcium score of 0. Patient is low risk for coronary events.   Aaron Edelman Agbor-Etang    EKG:  EKG is ordered today.  The ekg ordered today demonstrates NSR 66bpm, no significant ST/T wave changes  Recent Labs: 06/25/2022: TSH 1.244  Recent Lipid Panel No results found for: "CHOL", "TRIG", "HDL", "CHOLHDL", "VLDL", "LDLCALC",  "LDLDIRECT"  Physical Exam:    VS:  BP (!) 128/90 (BP Location: Left Arm, Patient Position: Sitting, Cuff Size: Normal)   Pulse 98   Wt 288 lb 12.8 oz (131 kg)   SpO2 97%   BMI 43.91 kg/m     Wt Readings from Last 3 Encounters:  06/25/22 288 lb 12.8 oz (131 kg)  07/26/20 278 lb (126.1 kg)  02/23/19 260 lb (117.9 kg)     GEN:  Well nourished, well developed in no acute distress HEENT: Normal NECK: No JVD; No carotid bruits LYMPHATICS: No lymphadenopathy CARDIAC: RRR, no murmurs, rubs, gallops RESPIRATORY:  Clear to auscultation without rales, wheezing or rhonchi  ABDOMEN: Soft, non-tender, non-distended MUSCULOSKELETAL:  No edema; No deformity  SKIN: Warm and dry NEUROLOGIC:  Alert and oriented x 3 PSYCHIATRIC:  Normal affect   ASSESSMENT:    1. Palpitations   2. Chest pain of uncertain etiology   3. Obstructive sleep apnea   4. Essential hypertension   5. Pre-operative cardiovascular examination    PLAN:    In order of problems listed above:  Palpitations Patient reports abnormal heartbeat and elevated blood pressure in the setting of flu and UTI.  Patient reports heart rates in the upper 90s.  EKG today shows sinus rhythm, right bundle branch block with a heart rate of 98 bpm.  Patient is slowly recovering from the flu, however he still feels tired and weak with minimal shortness of breath. Recent labs are unremarkable. I will order TSH. I will also order a 2-week heart monitor. I will see him back in 6-8 weeks to assess symptoms. I suspect this is most likely secondary to flu/UTI.  Atypical chest pain Patient reports atypical sharp chest pain, both on the left and the right sides.  EKG today with no ischemic changes.  Chest pain in the setting of flu.  Prior cardiac scoring was 0.  No further ischemic workup at this time.  OSA on CPAP He not using his CPAP I will refer him back to pulmonology to discuss management.  HTN He reported elevated BP in the setting of  UTI and flu.  BP today is normal, 128/90.  The patient takes olmesartan 5 mg daily.  We will assess this at follow-up.  RBBB Stable, no further workup.  Pre-op cardiac evaluation for rotator cuff repair Patient was previously scheduled for left rotator cuff repair this month, however in the setting of Flu/UTI, the surgery has been postponed until late February.  As above, the patient reports palpitations in the setting of flu/UTI.  I will order a heart monitor and TSH have above. I suspect symptoms are exacerbated by acute illness. If this shows no significant abnormalities, okay to proceed with surgery.  Patient reports  occasional atypical chest pain, he had had prior cardiac score in 2022 of 0,  which is overall reassuring.  Blood pressure and heart rate are good today. EKG with no changes.  METs greater than 4.  According to revised cardiac risk index patient is class I risk, 3.9% for 30-day risk of death, MI or cardiac arrest.  Disposition: Follow up in 6-8 week(s) with MD/APP     Signed, Kipling Graser Ninfa Meeker, PA-C  06/25/2022 10:33 AM    [place]

## 2022-06-25 NOTE — Patient Instructions (Addendum)
Medication Instructions:  - Your physician recommends that you continue on your current medications as directed. Please refer to the Current Medication list given to you today.  *If you need a refill on your cardiac medications before your next appointment, please call your pharmacy*   Lab Work: - Your physician recommends that you have lab work today:  [place] [facility] at [facility] 1st desk on the right to check in (REGISTRATION)  Lab hours: Monday- Friday (7:30 am- 5:30 pm)   If you have labs (blood work) drawn today and your tests are completely normal, you will receive your results only by: MyChart Message (if you have MyChart) OR A paper copy in the mail If you have any lab test that is abnormal or we need to change your treatment, we will call you to review the results.   Testing/Procedures: 1) You have been referred to : [facility] for management of sleep apnea.  Their office will call you to set up an appointment. If you do not hear from them in the next 1-2 weeks, please call [phone] to follow up.    2) Heart Monitor:  Length of Wear: 14 days  Your monitor will be mailed to your home address within 3-5 business days. However, if you have not received your monitor after 5 business days please send [place] a MyChart message or call the office at [phone], so we may follow up on this for you.   Your physician has recommended that you wear a Zio XT (heart)  monitor.   This monitor is a medical device that records the heart's electrical activity. Doctors most often use these monitors to diagnose arrhythmias. Arrhythmias are problems with the speed or rhythm of the heartbeat. The monitor is a small device applied to your chest. You can wear one while you do your normal daily activities. While wearing this monitor if you have any symptoms to push the button and record what you felt. Once you have worn this monitor for the period of time provider prescribed  (Usually 14 days), you will return the monitor device in the postage paid box. Once it is returned they will download the data collected and provide [place] with a report which the provider will then review and we will call you with those results. Important tips:  Avoid showering during the first 24 hours of wearing the monitor. Avoid excessive sweating to help maximize wear time. Do not submerge the device, no hot tubs, and no swimming pools. Keep any lotions or oils away from the patch. After 24 hours you may shower with the patch on. Take brief showers with your back facing the shower head.  Do not remove patch once it has been placed because that will interrupt data and decrease adhesive wear time. Push the button when you have any symptoms and write down what you were feeling. Once you have completed wearing your monitor, remove and place into box which has postage paid and place in your outgoing mailbox.  If for some reason you have misplaced your box then call our office and we can provide another box and/or mail it off for you.       Follow-Up: At [facility], you and your health needs are our priority.  As part of our continuing mission to provide you with exceptional heart care, we have created designated Provider Care Teams.  These Care Teams include your primary Cardiologist (physician) and Advanced Practice Providers (APPs -  Physician Assistants and Nurse Practitioners) who all work together to provide you with the care you need, when you need it.  We recommend signing up for the patient portal called "MyChart".  Sign up information is provided on this After Visit Summary.  MyChart is used to connect with patients for Virtual Visits (Telemedicine).  Patients are able to view lab/test results, encounter notes, upcoming appointments, etc.  Non-urgent messages can be sent to your provider as well.   To learn more about what you can do with MyChart, go to [email or web address].     Your next appointment:   6-8 week(s)  Provider:   You may see Ida Rogue, MD or one of the following Advanced Practice Providers on your designated Care Team:    Cadence Kathlen Mody, [place]   Other Instructions N/a

## 2022-07-01 DIAGNOSIS — R002 Palpitations: Secondary | ICD-10-CM

## 2022-07-20 ENCOUNTER — Ambulatory Visit: Payer: Managed Care, Other (non HMO) | Admitting: Primary Care

## 2022-07-20 ENCOUNTER — Encounter: Payer: Self-pay | Admitting: Primary Care

## 2022-07-20 VITALS — BP 122/80 | HR 84 | Temp 97.1°F | Ht 68.0 in | Wt 291.6 lb

## 2022-07-20 DIAGNOSIS — G4733 Obstructive sleep apnea (adult) (pediatric): Secondary | ICD-10-CM

## 2022-07-20 DIAGNOSIS — G4730 Sleep apnea, unspecified: Secondary | ICD-10-CM | POA: Diagnosis not present

## 2022-07-20 NOTE — Patient Instructions (Signed)
  Orders: CPAP titration study  Follow-up: 3 months with Eustaquio Maize NP

## 2022-07-20 NOTE — Progress Notes (Unsigned)
$@Patientc$  ID: [name], male    DOB: 1965-11-11, 57 y.o.   MRN: [id number]  No chief complaint on file.   Referring provider: Antony Madura, PA-C  HPI: 57 year old male, never smoked. PMH significant HTN, OSA, GERD, hepatic steatosis, obesity.  07/20/2022 Patient presents today for sleep consult. He was referred by cardiology d/t palpitations. Hx sleep apnea, not currently using CPAP. He had sleep study on 08/07/2019 that showed severe sleep apnea, average AHI 40/hr. He still has machine. He has tried several different masks but could not find one that worked well for him.  Previous DME company was in [place]. He still has symptoms of snoring. He feels he sleeps alright at night. Typical bed time is 9pm. It takes him <5 mins to fall asleep. Wakes up on average once to use the restroom. Starts his at 3am. Not currently wearing CPAP or oxygen. He sleeps in a recliner. Epworth score 5.    Allergies  Allergen Reactions   Azithromycin Diarrhea    Immunization History  Administered Date(s) Administered   Moderna Sars-Covid-2 Vaccination 02/03/2020    Past Medical History:  Diagnosis Date   Depression    Diverticulosis    GERD (gastroesophageal reflux disease)    Hypertension     Tobacco History: Social History   Tobacco Use  Smoking Status Never  Smokeless Tobacco Never   Counseling given: Not Answered   Outpatient Medications Prior to Visit  Medication Sig Dispense Refill   fluticasone (FLONASE) 50 MCG/ACT nasal spray Place into both nostrils every morning.     olmesartan (BENICAR) 5 MG tablet Take 1 tablet by mouth daily.     omeprazole (PRILOSEC) 40 MG capsule Take 1 tablet by mouth daily.     rosuvastatin (CRESTOR) 5 MG tablet Take 1 tablet by mouth daily.     No facility-administered medications prior to visit.      Review of Systems  Review of Systems  Constitutional: Negative.   HENT: Negative.    Respiratory: Negative.    Cardiovascular: Negative.       Physical Exam  There were no vitals taken for this visit. Physical Exam Constitutional:      Appearance: Normal appearance.  HENT:     Head: Normocephalic and atraumatic.     Mouth/Throat:     Mouth: Mucous membranes are moist.     Pharynx: Oropharynx is clear.  Cardiovascular:     Rate and Rhythm: Normal rate and regular rhythm.  Pulmonary:     Effort: Pulmonary effort is normal.     Breath sounds: Normal breath sounds.  Musculoskeletal:        General: Normal range of motion.  Skin:    General: Skin is warm and dry.  Neurological:     General: No focal deficit present.     Mental Status: He is alert and oriented to person, place, and time. Mental status is at baseline.  Psychiatric:        Mood and Affect: Mood normal.        Behavior: Behavior normal.        Thought Content: Thought content normal.        Judgment: Judgment normal.      Lab Results:  CBC No results found for: "WBC", "RBC", "HGB", "HCT", "PLT", "MCV", "MCH", "MCHC", "RDW", "LYMPHSABS", "MONOABS", "EOSABS", "BASOSABS"  BMET No results found for: "NA", "K", "CL", "CO2", "GLUCOSE", "BUN", "CREATININE", "CALCIUM", "GFRNONAA", "GFRAA"  BNP No results found for: "BNP"  ProBNP No  results found for: "PROBNP"  Imaging: No results found.   Assessment & Plan:   No problem-specific Assessment & Plan notes found for this encounter.     Martyn Ehrich, NP 07/20/2022

## 2022-07-23 NOTE — Assessment & Plan Note (Signed)
-   Hx sleep apnea, not currently on CPAP. He had sleep study on 08/07/2019 that showed severe sleep apnea, average AHI 40/hr. He continues to have symptoms snoring along with palpitations. He has two working CPAP machines, he has tried several masks but could not find one that worked. Needs CPAP titration study. FU after testing to review results/ establish with DME company for supplies.

## 2022-08-08 ENCOUNTER — Encounter: Payer: Self-pay | Admitting: Medical

## 2022-08-08 ENCOUNTER — Ambulatory Visit: Payer: Managed Care, Other (non HMO) | Attending: Medical | Admitting: Medical

## 2022-08-08 VITALS — BP 156/90 | HR 86 | Ht 68.0 in | Wt 290.0 lb

## 2022-08-08 DIAGNOSIS — R002 Palpitations: Secondary | ICD-10-CM

## 2022-08-08 DIAGNOSIS — I455 Other specified heart block: Secondary | ICD-10-CM

## 2022-08-08 DIAGNOSIS — G4733 Obstructive sleep apnea (adult) (pediatric): Secondary | ICD-10-CM | POA: Diagnosis not present

## 2022-08-08 DIAGNOSIS — I472 Ventricular tachycardia, unspecified: Secondary | ICD-10-CM

## 2022-08-08 DIAGNOSIS — I10 Essential (primary) hypertension: Secondary | ICD-10-CM

## 2022-08-08 NOTE — Patient Instructions (Signed)
Medication Instructions:   Your physician recommends that you continue on your current medications as directed. Please refer to the Current Medication list given to you today.  *If you need a refill on your cardiac medications before your next appointment, please call your pharmacy*   Lab Work:  1. None Ordered  If you have labs (blood work) drawn today and your tests are completely normal, you will receive your results only by: MyChart Message (if you have MyChart) OR A paper copy in the mail If you have any lab test that is abnormal or we need to change your treatment, we will call you to review the results.   Testing/Procedures:  Your physician has requested that you have an echocardiogram. Echocardiography is a painless test that uses sound waves to create images of your heart. It provides your doctor with information about the size and shape of your heart and how well your heart's chambers and valves are working. This procedure takes approximately one hour. There are no restrictions for this procedure. Please do NOT wear cologne, perfume, aftershave, or lotions (deodorant is allowed). Please arrive 15 minutes prior to your appointment time.    Follow-Up: At [facility], you and your health needs are our priority.  As part of our continuing mission to provide you with exceptional heart care, we have created designated Provider Care Teams.  These Care Teams include your primary Cardiologist (physician) and Advanced Practice Providers (APPs -  Physician Assistants and Nurse Practitioners) who all work together to provide you with the care you need, when you need it.  We recommend signing up for the patient portal called "MyChart".  Sign up information is provided on this After Visit Summary.  MyChart is used to connect with patients for Virtual Visits (Telemedicine).  Patients are able to view lab/test results, encounter notes, upcoming appointments, etc.  Non-urgent messages  can be sent to your provider as well.   To learn more about what you can do with MyChart, go to [email or web address].    Your next appointment:   2 month(s)  Provider:   You may see Ida Rogue, MD or one of the following Advanced Practice Providers on your designated Care Team:    Cadence [place], [place]

## 2022-08-08 NOTE — Progress Notes (Unsigned)
ELECTROPHYSIOLOGY CONSULT NOTE  Patient ID: Martin Johnson, MRN: [id number], DOB/AGE: Jul 29, 1965 57 y.o. Admit date: (Not on file) Date of Consult: 08/09/2022  Primary Physician: Martin Minium, PA Primary Cardiologist: Martin Johnson is a 57 y.o. male who is being seen today for the evaluation of VT at the request of CF-PA.    HPI Martin Johnson is a 57 y.o. male seen in consult for VT nonsustained  assoc with palps And also pauses detected on his monitor   Mid January, had 2 days where his heart was palpitating associate with diaphoresis and lightheadedness.  Lasted the first time about 5 hours went to bed the next morning persisted.  He was seen at the [facility] ER in [facility].  ECG data were reviewed which demonstrated frequent and consecutive PVCs, morphology was not described  Interestingly, he was diagnosed with flu and a "UTI "the latter following hematuria with clots.>> urologist consult.  Sleeping in the chair.  Denies edema.  Mild dyspnea on exertion.  No chest discomfort    DATE TEST EF   2/22 CTA   CaScore 0        Date Cr K Hgb  1/24 1.06 3.8 14.7         Untreated severe OSA failed to get fitted well, is now being followed by [facility]    Past Medical History:  Diagnosis Date   Depression    Diverticulosis    GERD (gastroesophageal reflux disease)    Hypertension       Surgical History:  Past Surgical History:  Procedure Laterality Date   COLONOSCOPY  08/24/2013, 12/02/2009   hyperplastic polyps ub rectum   COLONOSCOPY WITH PROPOFOL N/A 02/23/2019   Procedure: COLONOSCOPY WITH PROPOFOL;  Surgeon: Lollie Sails, MD;  Location: [facility] ENDOSCOPY;  Service: Endoscopy;  Laterality: N/A;   ESOPHAGOGASTRODUODENOSCOPY  08/24/2013   reflus gastroerophagistis, chronic gastritis in the body and antrum & single colon polyp in antrum.   ROTATOR CUFF REPAIR Left      Home Meds: Current Meds  Medication Sig   atenolol  (TENORMIN) 50 MG tablet Take 1 tablet (50 mg total) by mouth 2 (two) times daily.   bisoprolol (ZEBETA) 5 MG tablet Take 1 tablet (5 mg total) by mouth daily.   fluticasone (FLONASE) 50 MCG/ACT nasal spray Place into both nostrils every morning.   metoprolol tartrate (LOPRESSOR) 50 MG tablet Take 1 tablet (50 mg total) by mouth 2 (two) times daily.   omeprazole (PRILOSEC) 40 MG capsule Take 1 tablet by mouth daily.   rosuvastatin (CRESTOR) 5 MG tablet Take 1 tablet by mouth daily.   spironolactone (ALDACTONE) 25 MG tablet Take 0.5 tablets (12.5 mg total) by mouth daily.   [DISCONTINUED] olmesartan (BENICAR) 5 MG tablet Take 1 tablet by mouth daily.    Allergies:  Allergies  Allergen Reactions   Azithromycin Diarrhea    Social History   Socioeconomic History   Marital status: Married    Spouse name: Not on file   Number of children: Not on file   Years of education: Not on file   Highest education level: Not on file  Occupational History   Not on file  Tobacco Use   Smoking status: Never   Smokeless tobacco: Never  Vaping Use   Vaping Use: Never used  Substance and Sexual Activity   Alcohol use: No   Drug use: No   Sexual activity: Yes  Other Topics Concern   Not on file  Social History Narrative   Not on file   Social Determinants of Health   Financial Resource Strain: Not on file  Food Insecurity: Not on file  Transportation Needs: Not on file  Physical Activity: Not on file  Stress: Not on file  Social Connections: Not on file  Intimate Partner Violence: Not on file     Family History  Problem Relation Age of Onset   Cancer Mother    Colon cancer Father      ROS:  Please see the history of present illness.     All other systems reviewed and negative.    Physical Exam: Blood pressure 136/80, pulse 93, height '5\' 8"'$  (1.727 m), weight 280 lb (127 kg), SpO2 97 %. General: Well developed, Morbidly obese male in no acute distress. Head: Normocephalic,  atraumatic, sclera non-icteric, no xanthomas, nares are without discharge. EENT: normal  Lymph Nodes:  none Neck: Negative for carotid bruits. JVD 8 cm back:without scoliosis kyphosis Lungs: Clear bilaterally to auscultation without wheezes, rales, or rhonchi. Breathing is unlabored. Heart: RRR with S1 S2. No   murmur . No rubs, or gallops appreciated. Abdomen: Soft, non-tender, non-distended with normoactive bowel sounds. No hepatomegaly. No rebound/guarding. No obvious abdominal masses. Msk:  Strength and tone appear normal for age. Extremities: No clubbing or cyanosis. tr edema.  Distal pedal pulses are 2+ and equal bilaterally. Skin: Warm and Dry Neuro: Alert and oriented X 3. CN III-XII intact Grossly normal sensory and motor function . Psych:  Responds to questions appropriately with a normal affect.        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  Will undertake echocardiogram to look at left ventricular function and will transition his Benicar to a beta-blocker and have given him prescriptions for atenolol 50, metoprolol to tartrate 50 bisoprolol 5 to take to see if he can find 1 that he tolerates and is effective in quieting his palpitations.  The implications of the PVCs are twofold, the first is symptoms and the second is does not point to underlying structural heart disease.  The fact that he has conduction system disease suggest that there is some underlying structural disease, we will begin with an echocardiogram.  He may well fit for  MRI if need be.  He has HFpEF (presumed) awaiting his echo.  Will begin him on spironolactone at 12.5 twice daily have reviewed side effects we will get a metabolic profile in 2 weeks time  Encouraged him to follow-up with pulmonary regarding sleep apnea and I had to call back to make sure he had follow-up for his hematuria and he does not need       Martin Johnson

## 2022-08-08 NOTE — Progress Notes (Signed)
Cardiology Office Note:    Date:  08/08/2022   ID:  Martin Johnson, DOB [birth year], MRN [id number]  PCP:  Midge Minium, [facility] Cardiologist:  Ida Rogue, MD  [facility] Electrophysiologist:  None   Referring MD: Midge Minium, PA   Chief Complaint: Heart monitor follow-up  History of Present Illness:    Martin Johnson is a 57 y.o. male with a hx of  with a hx of severe OSA, CPAP, HTN, diverticulitis, atypical chest pain who presents for heart monitor follow-up.    The patient was seen in the ER at [facility] 07/2020 for chest pain. Work-up showed elevated BP and he was started on Benicar. Cardiac CT was ordered, which showed coronary calcium score of 0.    Last seen 1/22/4 and the patient reported palpitations and atypical chest pain. He had recently been diagnosed with the Flu and UTI. A heart monitor was ordered, which showed  NSR with, BBB/IVCS, 100 runs of VT longest lasting 15 beats, 3 pauses, longest 3.6, all overnight pause.   Today, the patient reports he had shoulder surgery on the left a week ago. He is in a brace for 8 weeks. Recovery is a full 4 months. He saw pulmonology and he has severe CPAP, he plans to do a test but unable to lay flat until his arm brace comes off. The patient denies chest pain, shortness of breath, lower leg edema.  Patient is not on AV nodal blocking medication.   Past Medical History:  Diagnosis Date   Depression    Diverticulosis    GERD (gastroesophageal reflux disease)    Hypertension     Past Surgical History:  Procedure Laterality Date   COLONOSCOPY  08/24/2013, 12/02/2009   hyperplastic polyps ub rectum   COLONOSCOPY WITH PROPOFOL N/A 02/23/2019   Procedure: COLONOSCOPY WITH PROPOFOL;  Surgeon: Lollie Sails, MD;  Location: [facility] ENDOSCOPY;  Service: Endoscopy;  Laterality: N/A;   ESOPHAGOGASTRODUODENOSCOPY  08/24/2013   reflus gastroerophagistis, chronic gastritis in the body and antrum & single colon  polyp in antrum.   ROTATOR CUFF REPAIR Left     Current Medications: Current Meds  Medication Sig   fluticasone (FLONASE) 50 MCG/ACT nasal spray Place into both nostrils every morning.   olmesartan (BENICAR) 5 MG tablet Take 1 tablet by mouth daily.   omeprazole (PRILOSEC) 40 MG capsule Take 1 tablet by mouth daily.   rosuvastatin (CRESTOR) 5 MG tablet Take 1 tablet by mouth daily.     Allergies:   Azithromycin   Social History   Socioeconomic History   Marital status: Married    Spouse name: Not on file   Number of children: Not on file   Years of education: Not on file   Highest education level: Not on file  Occupational History   Not on file  Tobacco Use   Smoking status: Never   Smokeless tobacco: Never  Substance and Sexual Activity   Alcohol use: No   Drug use: No   Sexual activity: Yes  Other Topics Concern   Not on file  Social History Narrative   Not on file   Social Determinants of Health   Financial Resource Strain: Not on file  Food Insecurity: Not on file  Transportation Needs: Not on file  Physical Activity: Not on file  Stress: Not on file  Social Connections: Not on file     Family History: The patient's family history includes Cancer in his mother; Colon  cancer in his father.  ROS:   Please see the history of present illness.     All other systems reviewed and are negative.  EKGs/Labs/Other Studies Reviewed:    The following studies were reviewed today:  Heart monitor 07/2022  Normal sinus rhythm Patient had a min HR of 43 bpm, max HR of 235 bpm, and avg HR of 83 bpm. Bundle Branch Block/IVCD was present.    100 Ventricular Tachycardia runs occurred, the run with the fastest interval lasting 6 beats with a max rate of 235 bpm, the longest lasting 15 beats with an avg rate of 157 bpm.    3 Pauses occurred, the longest lasting 3.6 secs (17 bpm).  All pauses occurring overnight midnight to 6 AM   Isolated SVEs were rare (<1.0%), SVE  Couplets were rare (<1.0%), and SVE Triplets were rare (<1.0%).  Isolated VEs were rare (<1.0%, 930), VE Couplets were rare (<1.0%, 416), and VE Triplets were rare (<1.0%, 43).  Ventricular Bigeminy and Trigeminy were present.   Triggered events (2) associated with normal sinus rhythm   Signed, Esmond Plants, MD, Ph.D [facility]  Cardiac CTA 08/2020  IMPRESSION: Normal coronary calcium score of 0. Patient is low risk for coronary events.   Kate Sable     Electronically Signed   By: Kate Sable M.D.   On: 08/11/2020 17:31  EKG:  EKG is not ordered today.    Recent Labs: 06/25/2022: TSH 1.244  Recent Lipid Panel No results found for: "CHOL", "TRIG", "HDL", "CHOLHDL", "VLDL", "LDLCALC", "LDLDIRECT"   Physical Exam:    VS:  BP (!) 156/90 (BP Location: Right Arm, Patient Position: Sitting, Cuff Size: Normal)   Pulse 86   Ht '5\' 8"'$  (1.727 m)   Wt 290 lb (131.5 kg)   SpO2 97%   BMI 44.09 kg/m     Wt Readings from Last 3 Encounters:  08/08/22 290 lb (131.5 kg)  07/20/22 291 lb 9.6 oz (132.3 kg)  06/25/22 288 lb 12.8 oz (131 kg)     GEN:  Well nourished, well developed in no acute distress HEENT: Normal NECK: No JVD; No carotid bruits LYMPHATICS: No lymphadenopathy CARDIAC: RRR, no murmurs, rubs, gallops RESPIRATORY:  Clear to auscultation without rales, wheezing or rhonchi  ABDOMEN: Soft, non-tender, non-distended MUSCULOSKELETAL:  No edema; No deformity  SKIN: Warm and dry NEUROLOGIC:  Alert and oriented x 3 PSYCHIATRIC:  Normal affect   ASSESSMENT:    1. V-tach (HCC)   2. Palpitations   3. Sinus pause   4. Obstructive sleep apnea   5. Essential hypertension    PLAN:    In order of problems listed above:  Palpitations NSVT Sinus Pauses Recent heart monitor showed normal sinus rhythm with average heart rate of 83, bundle branch block/IVCD, 100 runs of ventricular tachycardia, fastest interval lasting 6 beats and longest lasting 15 beats, 3  pauses, longest lasting 3.6 seconds.  All pauses occurred overnight.  Rare ectopy noted as well. Triggered events associated with normal sinus rhythm.  Patient was brought in for urgent visit.  Patient denies any dizziness, lightheadedness, loss of consciousness, chest pain, shortness of breath. No further palpitations noted. Patient is not on AV nodal blocking agents at baseline.  Patient does have a history of OSA not on CPAP, he has recently established with pulmonology. Prior cardiac CTA in 2022 showed calcium score of 0.  I will order an echocardiogram.  I will refer patient to EP.  Caution with procedures given recent  left shoulder surgery in a brace for 8 weeks, and he is unable to lay flat with the brace on.  OSA Patient has severe sleep apnea, he recently reestablished with pulmonology.  He will continue to follow with them.  HTN BP mildly elevated, however he does have pain from his shoulder surgery.  Continue olmesartan 5 mg daily.  Can address at follow-up if still elevated.  Disposition: Follow up in 2 month(s) with MD/APP     Signed, Savier Trickett Ninfa Meeker, PA-C  08/08/2022 2:45 PM    [facility]

## 2022-08-09 ENCOUNTER — Encounter: Payer: Self-pay | Admitting: Internal Medicine

## 2022-08-09 ENCOUNTER — Ambulatory Visit: Payer: Managed Care, Other (non HMO) | Attending: Internal Medicine | Admitting: Internal Medicine

## 2022-08-09 VITALS — BP 136/80 | HR 93 | Ht 68.0 in | Wt 280.0 lb

## 2022-08-09 DIAGNOSIS — I472 Ventricular tachycardia, unspecified: Secondary | ICD-10-CM

## 2022-08-09 DIAGNOSIS — R002 Palpitations: Secondary | ICD-10-CM | POA: Diagnosis not present

## 2022-08-09 MED ORDER — SPIRONOLACTONE 25 MG PO TABS: 12.5000 mg | ORAL_TABLET | Freq: Every day | ORAL | 3 refills | Status: DC

## 2022-08-09 MED ORDER — BISOPROLOL FUMARATE 5 MG PO TABS: 5.0000 mg | ORAL_TABLET | Freq: Every day | ORAL | 11 refills | Status: DC

## 2022-08-09 MED ORDER — ATENOLOL 50 MG PO TABS: 50.0000 mg | ORAL_TABLET | Freq: Two times a day (BID) | ORAL | 11 refills | Status: DC

## 2022-08-09 MED ORDER — METOPROLOL TARTRATE 50 MG PO TABS: 50.0000 mg | ORAL_TABLET | Freq: Two times a day (BID) | ORAL | 11 refills | Status: DC

## 2022-08-09 NOTE — Patient Instructions (Addendum)
Medication Instructions:  Your physician has recommended you make the following change in your medication:  1) STOP taking Benicar  2) START taking spironolactone 12.5 mg daily 3) We will start you on a beta blocker trial  A) START taking atenolol 50 mg twice daily. Please continue if you are able to tolerate.   B) If unable to tolerate atenolol start taking metoprolol tartrate 50 mg twice daily  C) If unable to tolerate metoprolol start taking bisoprolol 5 mg daily  *If you need a refill on your cardiac medications before your next appointment, please call your pharmacy*  Lab Work: IN TWO WEEKS: BMET You will get your lab work at [facility]) hospital.  Your lab work will be done at [facility] next to [employer].  These are walk in labs- you will not need an appointment and you do not need to be fasting.    Testing/Procedures: Echocardiogram scheduled for 4/17 - we will see if we can get this done sooner.  Follow-Up: At [facility], you and your health needs are our priority.  As part of our continuing mission to provide you with exceptional heart care, we have created designated Provider Care Teams.  These Care Teams include your primary Cardiologist (physician) and Advanced Practice Providers (APPs -  Physician Assistants and Nurse Practitioners) who all work together to provide you with the care you need, when you need it.  Your next appointment:   3 months  Provider:   Dr. Caryl Comes

## 2022-08-10 ENCOUNTER — Ambulatory Visit: Payer: Managed Care, Other (non HMO) | Attending: Medical

## 2022-08-10 DIAGNOSIS — I472 Ventricular tachycardia, unspecified: Secondary | ICD-10-CM | POA: Diagnosis not present

## 2022-08-10 MED ORDER — PERFLUTREN LIPID MICROSPHERE
1.0000 mL | INTRAVENOUS | Status: AC | PRN
  Administered 2022-08-10: 2 mL via INTRAVENOUS

## 2022-08-11 LAB — ECHOCARDIOGRAM COMPLETE
AR max vel: 2.8 cm2
AV Area VTI: 2.51 cm2
AV Area mean vel: 2.69 cm2
AV Mean grad: 6 mmHg
AV Peak grad: 10.2 mmHg
Ao pk vel: 1.6 m/s
Area-P 1/2: 2.91 cm2
S' Lateral: 3 cm

## 2022-08-13 ENCOUNTER — Telehealth: Payer: Self-pay | Admitting: Internal Medicine

## 2022-08-13 NOTE — Telephone Encounter (Signed)
The patient called in confused about his medications and what to take and when. He has been advised of the following and to call back with any further questions. He has been advised to call back if he cannot tolerate the Atenolol.   1) STOP taking Benicar  2) START taking spironolactone 12.5 mg daily 3) We will start you on a beta blocker trial             A) START taking atenolol 50 mg twice daily. Please continue if you are able to tolerate.              B) If unable to tolerate atenolol start taking metoprolol tartrate 50 mg twice daily             C) If unable to tolerate metoprolol start taking bisoprolol 5 mg daily

## 2022-08-13 NOTE — Telephone Encounter (Signed)
Pt c/o medication issue:  1. Name of Medication: atenolol (TENORMIN) 50 MG tablet   2. How are you currently taking this medication (dosage and times per day)? Taking 1/2 tablet instead of 1 tablet  3. Are you having a reaction (difficulty breathing--STAT)? No   4. What is your medication issue? Patient is inquiring about how he should take this medication

## 2022-08-14 ENCOUNTER — Ambulatory Visit: Payer: Managed Care, Other (non HMO) | Admitting: Medical

## 2022-08-15 ENCOUNTER — Telehealth: Payer: Self-pay | Admitting: Primary Care

## 2022-08-15 NOTE — Telephone Encounter (Signed)
Sleep Lab, Jerene Pitch @ [phone] Fax [phone]   Missing Information needed:  PT previous Sleep Study Report (Ins. Requires)  Please send it anyone. Call her if any questions.

## 2022-08-16 NOTE — Telephone Encounter (Signed)
Found sleep study that was done at [facility]. I have printed this and will fax the number provided.   Will close encounter.

## 2022-09-19 ENCOUNTER — Other Ambulatory Visit: Payer: Managed Care, Other (non HMO)

## 2022-09-24 ENCOUNTER — Ambulatory Visit: Payer: Managed Care, Other (non HMO) | Attending: Otolaryngology

## 2022-09-24 DIAGNOSIS — G4733 Obstructive sleep apnea (adult) (pediatric): Secondary | ICD-10-CM | POA: Diagnosis present

## 2022-09-24 DIAGNOSIS — I10 Essential (primary) hypertension: Secondary | ICD-10-CM | POA: Diagnosis not present

## 2022-09-24 DIAGNOSIS — K219 Gastro-esophageal reflux disease without esophagitis: Secondary | ICD-10-CM | POA: Insufficient documentation

## 2022-09-24 DIAGNOSIS — E669 Obesity, unspecified: Secondary | ICD-10-CM | POA: Insufficient documentation

## 2022-10-05 ENCOUNTER — Telehealth (HOSPITAL_BASED_OUTPATIENT_CLINIC_OR_DEPARTMENT_OTHER): Payer: Managed Care, Other (non HMO) | Admitting: Pulmonary Disease

## 2022-10-05 DIAGNOSIS — G4733 Obstructive sleep apnea (adult) (pediatric): Secondary | ICD-10-CM | POA: Diagnosis not present

## 2022-10-05 NOTE — Telephone Encounter (Signed)
CPAP 11 cm with N20 small

## 2022-10-08 NOTE — Telephone Encounter (Signed)
Will await Martin Johnson's recommendations.

## 2022-10-08 NOTE — Telephone Encounter (Signed)
Patient has hx sleep apnea. Please place order for patient to be restarted on CPAP at 11cm h20 with small N20 mask if patient is ok with resuming therapy. Needs FU 31-90 days after for compliance.

## 2022-10-08 NOTE — Telephone Encounter (Signed)
Pt. Wife calling back please advise

## 2022-10-08 NOTE — Addendum Note (Signed)
Addended by: Lajoyce Lauber A on: 10/08/2022 04:47 PM   Modules accepted: Orders

## 2022-10-08 NOTE — Telephone Encounter (Signed)
Patient is aware of results/recommendations and voiced his understanding. He stated that he currently has a new cpap with [facility]. Order placed to adjust setting. He will call back to schedule OV once setup on cpap. Nothing further needed.

## 2022-10-08 NOTE — Telephone Encounter (Signed)
Lm for patient.  

## 2022-11-14 DIAGNOSIS — I5030 Unspecified diastolic (congestive) heart failure: Secondary | ICD-10-CM | POA: Insufficient documentation

## 2022-11-14 DIAGNOSIS — I4510 Unspecified right bundle-branch block: Secondary | ICD-10-CM | POA: Insufficient documentation

## 2022-11-14 DIAGNOSIS — I498 Other specified cardiac arrhythmias: Secondary | ICD-10-CM | POA: Insufficient documentation

## 2022-11-15 ENCOUNTER — Ambulatory Visit: Payer: Managed Care, Other (non HMO) | Admitting: Internal Medicine

## 2022-11-15 NOTE — Progress Notes (Deleted)
        Patient Care Team: Cathie Hoops, PA as PCP - General (Family Medicine) Antonieta Iba, MD as PCP - Cardiology (Cardiology) Duke Salvia, MD as PCP - Electrophysiology (Cardiology)   HPI  Martin Johnson is a 57 y.o. male seen in followup for VT nonsustained assoc with palps in setting of normal heart WITH AN overall low burden.    ***  DATE TEST EF    2/22 CTA   CaScore 0   3/24 Echo  55-65%      Date Cr K Hgb  1/24 1.06 3.8 14.7             OSA recently with start of CPAP  Records and Results Reviewed***  Past Medical History:  Diagnosis Date   Depression    Diverticulosis    GERD (gastroesophageal reflux disease)    Hypertension     Past Surgical History:  Procedure Laterality Date   COLONOSCOPY  08/24/2013, 12/02/2009   hyperplastic polyps ub rectum   COLONOSCOPY WITH PROPOFOL N/A 02/23/2019   Procedure: COLONOSCOPY WITH PROPOFOL;  Surgeon: Christena Deem, MD;  Location: [facility] ENDOSCOPY;  Service: Endoscopy;  Laterality: N/A;   ESOPHAGOGASTRODUODENOSCOPY  08/24/2013   reflus gastroerophagistis, chronic gastritis in the body and antrum & single colon polyp in antrum.   ROTATOR CUFF REPAIR Left     No outpatient medications have been marked as taking for the 11/15/22 encounter (Appointment) with Duke Salvia, MD.    Allergies  Allergen Reactions   Azithromycin Diarrhea      Review of Systems negative except from HPI and PMH  Physical Exam There were no vitals taken for this visit. Well developed and well nourished in no acute distress HENT normal E scleral and icterus clear Neck Supple JVP flat; carotids brisk and full Clear to ausculation {CARD RHYTHM:10874} ***Regular rate and rhythm, no murmurs gallops or rub Soft with active bowel sounds No clubbing cyanosis {Numbers; edema:17696} Edema Alert and oriented, grossly normal motor and sensory function Skin Warm and Dry  ECG ***  CrCl cannot be calculated (No  successful lab value found.).   Assessment and  Plan       Current medicines are reviewed at length with the patient today .  The patient does not*** have concerns regarding medicines.

## 2022-11-15 NOTE — Progress Notes (Signed)
Cardiology Office Note Date:  11/16/2022  Patient ID:  Martin Johnson, Martin Johnson February 03, 1966, MRN [id number] PCP:  Cathie Hoops, PA  Cardiologist:  Julien Nordmann, MD Electrophysiologist: Sherryl Manges, MD    Chief Complaint: 3 mon NSVT follow-up  History of Present Illness: Martin Johnson is a 57 y.o. male with PMH notable for NSVT, OSA, obesity, HFpEF, hematuria; seen today for Sherryl Manges, MD for routine electrophysiology followup.  He saw Dr. Graciela Husbands 08/2022 to establish care after recent Zio monitor showed 100 runs of NSVT and pauses, with symptoms of palpitations.  Dr. Graciela Husbands provided prescriptions to multiple beta-blockers for patient to trial to see if 1 was effective at quieting palpitations.  Echo pending at the visit, but Dr. Graciela Husbands started him on 12.5 mg spironolactone twice daily.  Pending further OSA eval with pulm.  Today, he states that he overall feels very well.  His initial symptoms that led to the diagnosis of NSVT corresponded with influenza.  After last visit with Dr. Graciela Husbands, he started atenolol without issue and so did not try any of the other beta-blockers.  Checks blood pressure regularly most readings 120-1 30s over 70s. He has had CPAP titration and is tolerating it well.  Uses every night He just recently saw PCP, and had lab work.    he denies chest pain, palpitations, dyspnea, PND, orthopnea, nausea, vomiting, dizziness, syncope, edema, weight gain, or early satiety.     Past Medical History:  Diagnosis Date   Depression    Diverticulosis    GERD (gastroesophageal reflux disease)    Hypertension     Past Surgical History:  Procedure Laterality Date   COLONOSCOPY  08/24/2013, 12/02/2009   hyperplastic polyps ub rectum   COLONOSCOPY WITH PROPOFOL N/A 02/23/2019   Procedure: COLONOSCOPY WITH PROPOFOL;  Surgeon: Christena Deem, MD;  Location: [facility] ENDOSCOPY;  Service: Endoscopy;  Laterality: N/A;   ESOPHAGOGASTRODUODENOSCOPY  08/24/2013   reflus  gastroerophagistis, chronic gastritis in the body and antrum & single colon polyp in antrum.   ROTATOR CUFF REPAIR Left     Current Outpatient Medications  Medication Instructions   atenolol (TENORMIN) 50 mg, Oral, 2 times daily   celecoxib (CELEBREX) 200 mg, Oral, 2 times daily   fluticasone (FLONASE) 50 MCG/ACT nasal spray Each Nare, Daily PRN   omeprazole (PRILOSEC) 40 MG capsule 1 tablet, Oral, Daily   rosuvastatin (CRESTOR) 5 MG tablet 1 tablet, Oral, Daily   spironolactone (ALDACTONE) 12.5 mg, Oral, Daily    Social History:  The patient  reports that he has never smoked. He has never used smokeless tobacco. He reports that he does not drink alcohol and does not use drugs.   Family History:  The patient's family history includes Cancer in his mother; Colon cancer in his father.  ROS:  Please see the history of present illness. All other systems are reviewed and otherwise negative.   PHYSICAL EXAM:  VS:  BP 132/78   Pulse (!) 59   Ht 5\' 8"  (1.727 m)   Wt 297 lb (134.7 kg)   SpO2 98%   BMI 45.16 kg/m  BMI: Body mass index is 45.16 kg/m.  GEN- The patient is well appearing, alert and oriented x 3 today.   Lungs- Clear to ausculation bilaterally, normal work of breathing.  Heart- Regular rate and rhythm, no murmurs, rubs or gallops Extremities- Trace peripheral edema, warm, dry   EKG is ordered. Personal review of EKG from today shows: Sinus bradycardia, 59 bpm. right  bundle branch block, LAD  Recent Labs: 06/25/2022: TSH 1.244  No results found for requested labs within last 365 days.   CrCl cannot be calculated (No successful lab value found.).   Wt Readings from Last 3 Encounters:  11/16/22 297 lb (134.7 kg)  08/09/22 280 lb (127 kg)  08/08/22 290 lb (131.5 kg)     Additional studies reviewed include: Previous EP, cardiology notes.   TTE, 08/10/2022  1. Left ventricular ejection fraction, by estimation, is 60 to 65%. The left ventricle has normal function. The  left ventricle has no regional wall motion abnormalities. Left ventricular diastolic parameters are consistent with Grade I diastolic dysfunction (impaired relaxation).   2. Right ventricular systolic function is normal. The right ventricular size is normal.   3. The mitral valve is normal in structure. No evidence of mitral valve regurgitation. No evidence of mitral stenosis.   4. The aortic valve has an indeterminant number of cusps, unable to exclude bicuspid aortic valve (not well visualized). Aortic valve  regurgitation is not visualized. No aortic stenosis is present.   5. The inferior vena cava is normal in size with greater than 50% respiratory variability, suggesting right atrial pressure of 3 mmHg.   Long term monitor, 06/25/2022 Normal sinus rhythm Patient had a min HR of 43 bpm, max HR of 235 bpm, and avg HR of 83 bpm. Bundle Branch Block/IVCD was present.   100 Ventricular Tachycardia runs occurred, the run with the fastest interval lasting 6 beats with a max rate of 235 bpm, the longest lasting 15 beats with an avg rate of 157 bpm.   3 Pauses occurred, the longest lasting 3.6 secs (17 bpm).  All pauses occurring overnight midnight to 6 AM  Isolated SVEs were rare (<1.0%), SVE Couplets were rare (<1.0%), and SVE Triplets were rare (<1.0%).  Isolated VEs were rare (<1.0%, 930), VE Couplets were rare (<1.0%, 416), and VE Triplets were rare (<1.0%, 43).  Ventricular Bigeminy and Trigeminy were present.  Triggered events (2) associated with normal sinus rhythm  ASSESSMENT AND PLAN:  #) Palpitations #) NSVT Symptomatically, he feels much improved with resolution of palpitations.  Continue atenolol 50 mg twice daily EKG with bradycardia today 2-week Zio to reevaluate arrhythmia burden  #) HTN At goal today.  Recommend checking blood pressures 1-2 times per week at home and recording the values.  Recommend bringing these recordings to the primary care physician. Continue atenolol as  above Continue 12.5 Cleda Daub.  Recent labs with PCP stable   #) OSA #) nocturnal pauses Diligently uses CPAP every night Zio as above to reevaluate nocturnal pauses   Current medicines are reviewed at length with the patient today.   The patient does not have concerns regarding his medicines.  The following changes were made today:  none  Labs/ tests ordered today include:  Orders Placed This Encounter  Procedures   LONG TERM MONITOR (3-14 DAYS)   EKG 12-Lead     Disposition: Follow up with Dr. Graciela Husbands or EP APP in 6 months   Signed, Sherie Don, NP  11/16/22  9:52 AM  Electrophysiology [facility]

## 2022-11-16 ENCOUNTER — Encounter: Payer: Self-pay | Admitting: Cardiology

## 2022-11-16 ENCOUNTER — Ambulatory Visit: Payer: Managed Care, Other (non HMO)

## 2022-11-16 ENCOUNTER — Ambulatory Visit: Payer: Managed Care, Other (non HMO) | Attending: Internal Medicine | Admitting: Cardiology

## 2022-11-16 VITALS — BP 132/78 | HR 59 | Ht 68.0 in | Wt 297.0 lb

## 2022-11-16 DIAGNOSIS — I455 Other specified heart block: Secondary | ICD-10-CM | POA: Diagnosis not present

## 2022-11-16 DIAGNOSIS — R002 Palpitations: Secondary | ICD-10-CM

## 2022-11-16 DIAGNOSIS — I4729 Other ventricular tachycardia: Secondary | ICD-10-CM

## 2022-11-16 DIAGNOSIS — I10 Essential (primary) hypertension: Secondary | ICD-10-CM | POA: Diagnosis not present

## 2022-11-16 NOTE — Patient Instructions (Signed)
Medication Instructions:  Your physician recommends that you continue on your current medications as directed. Please refer to the Current Medication list given to you today.  *If you need a refill on your cardiac medications before your next appointment, please call your pharmacy*   Lab Work: No labs ordered  If you have labs (blood work) drawn today and your tests are completely normal, you will receive your results only by: MyChart Message (if you have MyChart) OR A paper copy in the mail If you have any lab test that is abnormal or we need to change your treatment, we will call you to review the results.   Testing/Procedures: Your physician has recommended that you wear a Zio monitor.   This monitor is a medical device that records the heart's electrical activity. Doctors most often use these monitors to diagnose arrhythmias. Arrhythmias are problems with the speed or rhythm of the heartbeat. The monitor is a small device applied to your chest. You can wear one while you do your normal daily activities. While wearing this monitor if you have any symptoms to push the button and record what you felt. Once you have worn this monitor for the period of time provider prescribed (Usually 14 days), you will return the monitor device in the postage paid box. Once it is returned they will download the data collected and provide [place] with a report which the provider will then review and we will call you with those results. Important tips:  Avoid showering during the first 24 hours of wearing the monitor. Avoid excessive sweating to help maximize wear time. Do not submerge the device, no hot tubs, and no swimming pools. Keep any lotions or oils away from the patch. After 24 hours you may shower with the patch on. Take brief showers with your back facing the shower head.  Do not remove patch once it has been placed because that will interrupt data and decrease adhesive wear time. Push the button when  you have any symptoms and write down what you were feeling. Once you have completed wearing your monitor, remove and place into box which has postage paid and place in your outgoing mailbox.  If for some reason you have misplaced your box then call our office and we can provide another box and/or mail it off for you.  Follow-Up: At [facility], you and your health needs are our priority.  As part of our continuing mission to provide you with exceptional heart care, we have created designated Provider Care Teams.  These Care Teams include your primary Cardiologist (physician) and Advanced Practice Providers (APPs -  Physician Assistants and Nurse Practitioners) who all work together to provide you with the care you need, when you need it.  We recommend signing up for the patient portal called "MyChart".  Sign up information is provided on this After Visit Summary.  MyChart is used to connect with patients for Virtual Visits (Telemedicine).  Patients are able to view lab/test results, encounter notes, upcoming appointments, etc.  Non-urgent messages can be sent to your provider as well.   To learn more about what you can do with MyChart, go to [email or web address].    Your next appointment:   6 month(s)  Provider:   Sherryl Manges, MD or Sherie Don, NP   Other Instructions Please monitor blood pressure and call our office or send readings through My Chart if consistently elevated.

## 2022-12-08 DIAGNOSIS — I4729 Other ventricular tachycardia: Secondary | ICD-10-CM | POA: Diagnosis not present

## 2022-12-17 ENCOUNTER — Telehealth: Payer: Self-pay | Admitting: Cardiology

## 2022-12-17 NOTE — Telephone Encounter (Signed)
Spoke with patient who states he just mailed it back Friday, 12/14/22, through [employer]. No tracking information available as of yet in [employer]. Will continue to monitor.

## 2022-12-17 NOTE — Telephone Encounter (Signed)
Email notification received from [employer] stating the patient's ZIO XT, ordered on 11/16/22 by Sherie Don, NP is still pending return.  Serial #: [id number]  Will forward to Suzann's covering CMA as an FYI and to follow up with the patient.

## 2022-12-18 NOTE — Telephone Encounter (Signed)
Monitor Status Christena Deem [id number] 11/16/2022 Processing Data  Processing start date: 12/18/2022

## 2023-03-29 ENCOUNTER — Ambulatory Visit: Payer: Managed Care, Other (non HMO) | Admitting: Primary Care

## 2023-05-31 ENCOUNTER — Ambulatory Visit: Payer: Managed Care, Other (non HMO) | Admitting: Primary Care

## 2023-07-02 ENCOUNTER — Ambulatory Visit: Payer: Managed Care, Other (non HMO) | Admitting: Primary Care

## 2023-07-02 NOTE — Progress Notes (Deleted)
 @  Patient ID: Martin Johnson, male    DOB: [birth year], 58 y.o.   MRN: [id number]  No chief complaint on file.   Referring provider: Cathie Hoops, PA  HPI:  58 year old male, never smoked. PMH significant HTN, OSA, GERD, hepatic steatosis, obesity.  07/20/2022 Patient presents today for sleep consult. He was referred by cardiology d/t palpitations. Hx sleep apnea, not currently using CPAP. He had sleep study on 08/07/2019 that showed severe sleep apnea, average AHI 40/hr. He still has machine. He has tried several different masks but could not find one that worked well for him.  Previous DME company was in [place]. He still has symptoms of snoring. He feels he sleeps alright at night. Typical bed time is 9pm. It takes him <5 mins to fall asleep. Wakes up on average once to use the restroom. Starts his at 3am. Not currently wearing CPAP or oxygen. He sleeps in a recliner. Epworth score 5.    OSA (obstructive sleep apnea) - Hx sleep apnea, not currently on CPAP. He had sleep study on 08/07/2019 that showed severe sleep apnea, average AHI 40/hr. He continues to have symptoms snoring along with palpitations. He has two working CPAP machines, he has tried several masks but could not find one that worked. Needs CPAP titration study. FU after testing to review results/ establish with DME company for supplies.    07/02/2023 Patient presents today for 1 year follow-up.  Patient had sleep study in March 2021 that showed severe obstructive sleep apnea with average AHI 40. He [facility]       Allergies  Allergen Reactions   Azithromycin Diarrhea    Immunization History  Administered Date(s) Administered   Moderna Sars-Covid-2 Vaccination 02/03/2020, 03/02/2020   Zoster Recombinant(Shingrix) 04/16/2022    Past Medical History:  Diagnosis Date   Depression    Diverticulosis    GERD (gastroesophageal reflux disease)    Hypertension     Tobacco  History: Social History   Tobacco Use  Smoking Status Never  Smokeless Tobacco Never   Counseling given: Not Answered   Outpatient Medications Prior to Visit  Medication Sig Dispense Refill   atenolol (TENORMIN) 50 MG tablet Take 1 tablet (50 mg total) by mouth 2 (two) times daily. 60 tablet 11   celecoxib (CELEBREX) 200 MG capsule Take 200 mg by mouth 2 (two) times daily.     fluticasone (FLONASE) 50 MCG/ACT nasal spray Place into both nostrils daily as needed.     omeprazole (PRILOSEC) 40 MG capsule Take 1 tablet by mouth daily.     rosuvastatin (CRESTOR) 5 MG tablet Take 1 tablet by mouth daily.     spironolactone (ALDACTONE) 25 MG tablet Take 0.5 tablets (12.5 mg total) by mouth daily. 45 tablet 3   No facility-administered medications prior to visit.      Review of Systems  Review of Systems   Physical Exam  There were no vitals taken for this visit. Physical Exam   Lab Results:  CBC No results found for: "WBC", "RBC", "HGB", "HCT", "PLT", "MCV", "MCH", "MCHC", "RDW", "LYMPHSABS", "MONOABS", "EOSABS", "BASOSABS"  BMET No results found for: "NA", "K", "CL", "CO2", "GLUCOSE", "BUN", "CREATININE", "CALCIUM", "GFRNONAA", "GFRAA"  BNP No results found for: "BNP"  ProBNP No results found for: "PROBNP"  Imaging: No results found.   Assessment & Plan:   No problem-specific Assessment & Plan notes found for this encounter.     Glenford Bayley, NP 07/02/2023

## 2023-07-08 ENCOUNTER — Encounter: Payer: Self-pay | Admitting: Sleep Medicine

## 2023-07-08 ENCOUNTER — Ambulatory Visit: Payer: Managed Care, Other (non HMO) | Admitting: Sleep Medicine

## 2023-07-08 VITALS — BP 140/80 | HR 58 | Ht 68.0 in | Wt 286.0 lb

## 2023-07-08 DIAGNOSIS — G4733 Obstructive sleep apnea (adult) (pediatric): Secondary | ICD-10-CM | POA: Diagnosis not present

## 2023-07-08 DIAGNOSIS — I10 Essential (primary) hypertension: Secondary | ICD-10-CM | POA: Diagnosis not present

## 2023-07-08 NOTE — Patient Instructions (Signed)
Patient Instructions  Continue to use CPAP every night, minimum of 7-8 hours a night.  Change mask every 30 days or as directed by DME.  Wash your tubing with warm soap and water weekly, hang to dry. Wash humidifier portion weekly. Use distilled water and change daily   Be aware of reduced alertness and do not drive or operate heavy machinery if experiencing this or drowsiness.  Exercise encouraged, as tolerated. Encouraged proper weight management.  Important to get eight or more hours of sleep  Limiting the use of the computer and television before bedtime.  Decrease naps during the day, so night time sleep will become enhanced.  Limit caffeine, and sleep deprivation.  HTN, stroke, uncontrolled diabetes and heart failure are potential risk factors.  Risk of untreated sleep apnea including cardiac arrhthymias, stroke, DM, pulm HTN.   Follow up in 6 months

## 2023-07-08 NOTE — Progress Notes (Signed)
Name:Martin Johnson MRN: [id number] DOB: 1965-12-30   CHIEF COMPLAINT:  ESTABLISH CARE FOR OSA   HISTORY OF PRESENT ILLNESS:  [name] is a 58 y.o. w/ a h/o OSA, HTN, GERD and who presents to establish care for OSA. Reports that he was initially diagnosed with OSA around 3 years and was subsequently started on CPAP therapy. Reports using CPAP therapy every night, which is confirmed by compliance data. He is currently set to 11 cm H2O. Reports using Airfit N20 nasal mask, which is comfortable. Reports feeling more refreshed upon awakening with CPAP therapy. Denies snoring with CPAP therapy. Reports 1 nocturnal awakening due to nocturia, however does not have difficulty falling back to sleep. Denies any significant weight changes. Denies morning headaches, night sweats or dry mouth. Denies a family history of sleep apnea. Denies drowsy driving.   Bedtime 9 pm Sleep onset 15 mins Rise time 3 am   EPWORTH SLEEP SCORE 5    07/20/2022    3:00 PM  Results of the Epworth flowsheet  Sitting and reading 1  Watching TV 3  Sitting, inactive in a public place (e.g. a theatre or a meeting) 0  As a passenger in a car for an hour without a break 0  Lying down to rest in the afternoon when circumstances permit 1  Sitting and talking to someone 0  Sitting quietly after a lunch without alcohol 0  In a car, while stopped for a few minutes in traffic 0  Total score 5    PAST MEDICAL HISTORY :   has a past medical history of Depression, Diverticulosis, GERD (gastroesophageal reflux disease), and Hypertension.  has a past surgical history that includes Colonoscopy (08/24/2013, 12/02/2009); Esophagogastroduodenoscopy (08/24/2013); Colonoscopy with propofol (N/A, 02/23/2019); and Rotator cuff repair (Left). Prior to Admission medications   Medication Sig Start Date End Date Taking? Authorizing Provider  atenolol (TENORMIN) 50 MG tablet Take 1 tablet (50 mg total) by mouth 2 (two) times  daily. 08/09/22  Yes Duke Salvia, MD  celecoxib (CELEBREX) 200 MG capsule Take 200 mg by mouth 2 (two) times daily.   Yes [provider]  fluticasone (FLONASE) 50 MCG/ACT nasal spray Place into both nostrils daily as needed.   Yes [provider]  omeprazole (PRILOSEC) 40 MG capsule Take 1 tablet by mouth daily. 10/20/15  Yes [provider]  rosuvastatin (CRESTOR) 5 MG tablet Take 1 tablet by mouth daily. 05/01/22  Yes [provider]  spironolactone (ALDACTONE) 25 MG tablet Take 0.5 tablets (12.5 mg total) by mouth daily. 08/09/22  Yes Duke Salvia, MD   Allergies  Allergen Reactions   Azithromycin Diarrhea    FAMILY HISTORY:  family history includes Cancer in his mother; Colon cancer in his father. SOCIAL HISTORY:  reports that he has never smoked. He has never used smokeless tobacco. He reports that he does not drink alcohol and does not use drugs.   Review of Systems:  Gen:  Denies  fever, sweats, chills weight loss  HEENT: Denies blurred vision, double vision, ear pain, eye pain, hearing loss, nose bleeds, sore throat Cardiac:  No dizziness, chest pain or heaviness, chest tightness,edema, No JVD Resp:   No cough, -sputum production, -shortness of breath,-wheezing, -hemoptysis,  Gi: Denies swallowing difficulty, stomach pain, nausea or vomiting, diarrhea, constipation, bowel incontinence Gu:  Denies bladder incontinence, burning urine Ext:   Denies Joint pain, stiffness or swelling Skin: Denies  skin rash, easy bruising or bleeding  or hives Endoc:  Denies polyuria, polydipsia , polyphagia or weight change Psych:   Denies depression, insomnia or hallucinations  Other:  All other systems negative  VITAL SIGNS: BP (!) 140/80   Pulse (!) 58   Ht 5\' 8"  (1.727 m)   Wt 286 lb (129.7 kg)   SpO2 98%   BMI 43.49 kg/m    Physical Examination:   General Appearance: No distress  EYES PERRLA, EOM intact.   NECK Supple, No JVD Throat  Mallampati  Pulmonary: normal breath sounds, No wheezing.  CardiovascularNormal S1,S2.  No m/r/g.   Abdomen: Benign, Soft, non-tender. Skin:   warm, no rashes, no ecchymosis  Extremities: normal, no cyanosis, clubbing. Neuro:without focal findings,  speech normal  PSYCHIATRIC: Mood, affect within normal limits.   ASSESSMENT AND PLAN  OSA Patient is using and benefiting from CPAP therapy. Counseled patient on increasing CPAP compliance. Discussed the consequences of untreated sleep apnea. Order sent for DME supplies. Advised not to drive drowsy for safety of patient and others. Will follow up in 6 months to review CPAP efficacy and compliance data.   HTN Stable, on current management. Following with PCP.   Morbid obesity Counseled patient on diet and lifestyle modification.    Patient  satisfied with Plan of action and management. All questions answered  Follow up in 6 months to review CPAP efficacy and compliance data.   I spent a total of  30 minutes reviewing chart data, face-to-face evaluation with the patient, counseling and coordination of care as detailed above.    Tempie Hoist, M.D.  Sleep Medicine [facility]

## 2023-07-26 ENCOUNTER — Other Ambulatory Visit: Payer: Self-pay | Admitting: Internal Medicine

## 2023-07-26 NOTE — Telephone Encounter (Signed)
Last office visit: 11/16/22 with plan to f/u 6 months.  next office visit: none/active recall  Please schedule f/u appt.  Thanks!

## 2023-07-26 NOTE — Telephone Encounter (Signed)
 This is a Educational psychologist pt

## 2023-07-29 NOTE — Telephone Encounter (Signed)
 Scheduled 08/09/23 with Percell Belt

## 2023-08-08 NOTE — Progress Notes (Signed)
 Electrophysiology Clinic Note    Date:  08/09/2023  Patient ID:  Martin Johnson, Martin Johnson May 27, 1966, MRN [id number] PCP:  Cathie Hoops, PA  Cardiologist:  Julien Nordmann, MD Electrophysiologist: Sherryl Manges, MD   Discussed the use of AI scribe software for clinical note transcription with the patient, who gave verbal consent to proceed.   Patient Profile    Chief Complaint: 6mon follow-up, past due  History of Present Illness: Martin Johnson is a 58 y.o. male with PMH notable for NSVT, OSA on CPAP, HFpEF, ; seen today for Sherryl Manges, MD for routine electrophysiology followup.   He saw Dr. Graciela Husbands 08/2022 to establish care after recent Zio monitor showed 100 runs of NSVT and pauses, with symptoms of palpitations.  Dr. Graciela Husbands provided prescriptions to multiple beta-blockers for patient to trial to see if 1 was effective at quieting palpitations. I saw him in follow-up 6/22024 he had started atenolol with improvement in palps, so did not trial other BB options. Ambulatory monitor at that visit with multiple nocturnal pauses.   On follow-up today, he is overall doing well. He denies palpitations, states he never had any, arrhythmia was incidentally found on routine EKG. He is using his CPAP more often, recently had follow-up with pulm.  He has noticed forearm hair thinning and that he bruises easier than before. He is not taking any NSAIDs, aspirin, goody's powder, or other blood thinners. He is also experiencing ED, noticed it with the addition of "blood pressure" medications.   he denies chest pain, palpitations, dyspnea, PND, orthopnea, nausea, vomiting, dizziness, syncope, edema, weight gain, or early satiety.      ROS:  Please see the history of present illness. All other systems are reviewed and otherwise negative.    Physical Exam    VS:  BP 120/70 (BP Location: Left Arm, Patient Position: Sitting, Cuff Size: Large)   Pulse (!) 52   Ht 5\' 8"  (1.727 m)   Wt 278 lb 4 oz  (126.2 kg)   SpO2 97%   BMI 42.31 kg/m  BMI: Body mass index is 42.31 kg/m.  Wt Readings from Last 3 Encounters:  08/09/23 278 lb 4 oz (126.2 kg)  07/08/23 286 lb (129.7 kg)  11/16/22 297 lb (134.7 kg)     GEN- The patient is well appearing, alert and oriented x 3 today.   Lungs- Clear to ausculation bilaterally, normal work of breathing.  Heart- Regular rate and rhythm, no murmurs, rubs or gallops Extremities- No peripheral edema, warm, dry   Studies Reviewed   Previous EP, cardiology notes.    EKG is ordered. Personal review of EKG from today shows:    EKG Interpretation Date/Time:  Friday August 09 2023 14:22:43 EST Ventricular Rate:  52 PR Interval:  184 QRS Duration:  166 QT Interval:  492 QTC Calculation: 457 R Axis:   -11  Text Interpretation: Sinus bradycardia Right bundle branch block Confirmed by Sherie Don [phone]) on 08/09/2023 2:28:02 PM    Long term monitor, 12/24/2022 Patient had a min HR of 36 bpm, max HR of 104 bpm, and avg HR of 62 bpm. Predominant underlying rhythm was Sinus Rhythm. Bundle Branch Block/IVCD was present. 8 Pauses occurred, the longest lasting 4.4 secs (13 bpm). Isolated SVEs were rare (<1.0%), and  no SVE Couplets or SVE Triplets were present. Isolated VEs were rare (<1.0%), VE Couplets were rare (<1.0%), and no VE Triplets were present.   Impression 1-insignificant ventricular tachycardia 2-pauses-nocturnal possibly associated  with sleep apnea>> clinical correlation recommended regarding the need for sleep study  TTE, 08/10/2022  1. Left ventricular ejection fraction, by estimation, is 60 to 65%. The left ventricle has normal function. The left ventricle has no regional wall motion abnormalities. Left ventricular diastolic parameters are consistent with Grade I diastolic dysfunction (impaired relaxation).   2. Right ventricular systolic function is normal. The right ventricular size is normal.   3. The mitral valve is normal in structure.  No evidence of mitral valve regurgitation. No evidence of mitral stenosis.   4. The aortic valve has an indeterminant number of cusps, unable to exclude bicuspid aortic valve (not well visualized). Aortic valve  regurgitation is not visualized. No aortic stenosis is present.   5. The inferior vena cava is normal in size with greater than 50% respiratory variability, suggesting right atrial pressure of 3 mmHg.    Long term monitor, 06/25/2022 Normal sinus rhythm Patient had a min HR of 43 bpm, max HR of 235 bpm, and avg HR of 83 bpm. Bundle Branch Block/IVCD was present.   100 Ventricular Tachycardia runs occurred, the run with the fastest interval lasting 6 beats with a max rate of 235 bpm, the longest lasting 15 beats with an avg rate of 157 bpm.   3 Pauses occurred, the longest lasting 3.6 secs (17 bpm).  All pauses occurring overnight midnight to 6 AM  Isolated SVEs were rare (<1.0%), SVE Couplets were rare (<1.0%), and SVE Triplets were rare (<1.0%).  Isolated VEs were rare (<1.0%, 930), VE Couplets were rare (<1.0%, 416), and VE Triplets were rare (<1.0%, 43).  Ventricular Bigeminy and Trigeminy were present.  Triggered events (2) associated with normal sinus rhythm    Assessment and Plan     #) NSVT #) RBBB Significant improvement in NSVT with the addition of atenolol He is tolerating BB well, except for ED Recommended he discuss ED treatment with his PCP, no plans to stop atenolol at this time  #) HTN At goal today.  Recommend checking blood pressures 1-2 times per week at home and recording the values.  Recommend bringing these recordings to the primary care physician. Continue 50mg  atenolol BID Continue 12.5mg  spiro    #) nocturnal pauses #) OSA on CPAP Encouraged to continue to use CPAP all night, every time He has been working toward that, and is tolerating CPAP better       Current medicines are reviewed at length with the patient today.   The patient has  concerns regarding his medicines.  The following changes were made today:  none  Labs/ tests ordered today include:  Orders Placed This Encounter  Procedures   EKG 12-Lead     Disposition: Follow up with Dr. Jimmey Ralph or EP APP in 6 months, prefer MD to establish with new MD in Dr. Odessa Fleming retirement   Signed, Sherie Don, NP  08/09/23  2:28 PM  Electrophysiology [facility]

## 2023-08-09 ENCOUNTER — Encounter: Payer: Self-pay | Admitting: Cardiology

## 2023-08-09 ENCOUNTER — Ambulatory Visit: Payer: Managed Care, Other (non HMO) | Attending: Cardiology | Admitting: Cardiology

## 2023-08-09 VITALS — BP 120/70 | HR 52 | Ht 68.0 in | Wt 278.2 lb

## 2023-08-09 DIAGNOSIS — I4729 Other ventricular tachycardia: Secondary | ICD-10-CM

## 2023-08-09 DIAGNOSIS — I10 Essential (primary) hypertension: Secondary | ICD-10-CM | POA: Diagnosis not present

## 2023-08-09 DIAGNOSIS — I455 Other specified heart block: Secondary | ICD-10-CM | POA: Diagnosis not present

## 2023-08-09 DIAGNOSIS — G4733 Obstructive sleep apnea (adult) (pediatric): Secondary | ICD-10-CM

## 2023-08-09 DIAGNOSIS — R002 Palpitations: Secondary | ICD-10-CM

## 2023-08-09 MED ORDER — SPIRONOLACTONE 25 MG PO TABS: 12.5000 mg | ORAL_TABLET | Freq: Every day | ORAL | 3 refills | Status: AC

## 2023-08-09 NOTE — Patient Instructions (Signed)
 Medication Instructions:  The current medical regimen is effective;  continue present plan and medications.  *If you need a refill on your cardiac medications before your next appointment, please call your pharmacy*   Follow-Up: At [facility], you and your health needs are our priority.  As part of our continuing mission to provide you with exceptional heart care, we have created designated Provider Care Teams.  These Care Teams include your primary Cardiologist (physician) and Advanced Practice Providers (APPs -  Physician Assistants and Nurse Practitioners) who all work together to provide you with the care you need, when you need it.  We recommend signing up for the patient portal called "MyChart".  Sign up information is provided on this After Visit Summary.  MyChart is used to connect with patients for Virtual Visits (Telemedicine).  Patients are able to view lab/test results, encounter notes, upcoming appointments, etc.  Non-urgent messages can be sent to your provider as well.   To learn more about what you can do with MyChart, go to [email or web address].    Your next appointment:   6 month(s)  Provider:   Nobie Putnam, MD or Sherie Don, NP

## 2023-08-31 ENCOUNTER — Other Ambulatory Visit: Payer: Self-pay | Admitting: Internal Medicine

## 2024-01-07 ENCOUNTER — Ambulatory Visit: Admitting: Sleep Medicine

## 2024-01-08 ENCOUNTER — Ambulatory Visit: Admitting: Sleep Medicine

## 2024-01-09 ENCOUNTER — Ambulatory Visit: Admitting: Sleep Medicine

## 2024-01-09 ENCOUNTER — Encounter: Payer: Self-pay | Admitting: Sleep Medicine

## 2024-01-09 VITALS — BP 120/80 | HR 56 | Temp 98.0°F | Ht 68.0 in | Wt 260.0 lb

## 2024-01-09 DIAGNOSIS — I10 Essential (primary) hypertension: Secondary | ICD-10-CM

## 2024-01-09 DIAGNOSIS — G4733 Obstructive sleep apnea (adult) (pediatric): Secondary | ICD-10-CM | POA: Diagnosis not present

## 2024-01-09 NOTE — Progress Notes (Unsigned)
 Name:Jaison RESEAN BRANDER MRN: [id number] DOB: [birth year]   CHIEF COMPLAINT:  CPAP F/U   HISTORY OF PRESENT ILLNESS:  Mr. Hegner is a 58 y.o. w/ a h/o OSA, HTN, GERD and obesity who presents for CPAP follow up visit. Reports using the Airfit N20 nasal mask, which is comfortable. Denies nasal congestion. Reports not feeling any difference in energy levels with CPAP therapy.    EPWORTH SLEEP SCORE    07/20/2022    3:00 PM  Results of the Epworth flowsheet  Sitting and reading 1  Watching TV 3  Sitting, inactive in a public place (e.g. a theatre or a meeting) 0  As a passenger in a car for an hour without a break 0  Lying down to rest in the afternoon when circumstances permit 1  Sitting and talking to someone 0  Sitting quietly after a lunch without alcohol 0  In a car, while stopped for a few minutes in traffic 0  Total score 5     PAST MEDICAL HISTORY :   has a past medical history of Depression, Diverticulosis, GERD (gastroesophageal reflux disease), and Hypertension.  has a past surgical history that includes Colonoscopy (08/24/2013, 12/02/2009); Esophagogastroduodenoscopy (08/24/2013); Colonoscopy with propofol  (N/A, 02/23/2019); and Rotator cuff repair (Left). Prior to Admission medications   Medication Sig Start Date End Date Taking? Authorizing Provider  atenolol  (TENORMIN ) 50 MG tablet TAKE 1 TABLET BY MOUTH TWICE A DAY 09/02/23   Fernande Elspeth BROCKS, MD  fluticasone [facility]) 50 MCG/ACT nasal spray Place into both nostrils daily as needed.    [provider]  omeprazole (PRILOSEC) 40 MG capsule Take 1 tablet by mouth daily. 10/20/15   [provider]  spironolactone  (ALDACTONE ) 25 MG tablet Take 0.5 tablets (12.5 mg total) by mouth daily. 08/09/23   Riddle, Suzann, NP   Allergies  Allergen Reactions   Azithromycin Diarrhea    FAMILY HISTORY:  family history includes Cancer in his mother; Colon cancer in his father. SOCIAL HISTORY:  reports that he  has never smoked. He has never used smokeless tobacco. He reports that he does not drink alcohol and does not use drugs.   Review of Systems:  Gen:  Denies  fever, sweats, chills weight loss  HEENT: Denies blurred vision, double vision, ear pain, eye pain, hearing loss, nose bleeds, sore throat Cardiac:  No dizziness, chest pain or heaviness, chest tightness,edema, No JVD Resp:   No cough, -sputum production, -shortness of breath,-wheezing, -hemoptysis,  Gi: Denies swallowing difficulty, stomach pain, nausea or vomiting, diarrhea, constipation, bowel incontinence Gu:  Denies bladder incontinence, burning urine Ext:   Denies Joint pain, stiffness or swelling Skin: Denies  skin rash, easy bruising or bleeding or hives Endoc:  Denies polyuria, polydipsia , polyphagia or weight change Psych:   Denies depression, insomnia or hallucinations  Other:  All other systems negative  VITAL SIGNS: BP 120/80 (BP Location: Left Arm, Patient Position: Sitting, Cuff Size: Large)   Pulse (!) 56   Temp 98 F (36.7 C) (Oral)   Ht 5' 8 (1.727 m)   Wt 260 lb (117.9 kg)   SpO2 97%   BMI 39.53 kg/m     Physical Examination:   General Appearance: No distress  EYES PERRLA, EOM intact.   NECK Supple, No JVD Pulmonary: normal breath sounds, No wheezing.  CardiovascularNormal S1,S2.  No m/r/g.   Abdomen: Benign, Soft, non-tender. Skin:   warm, no rashes, no ecchymosis  Extremities: normal, no cyanosis, clubbing.  Neuro:without focal findings,  speech normal  PSYCHIATRIC: Mood, affect within normal limits.   ASSESSMENT AND PLAN  OSA Patient is using and benefiting from CPAP therapy. Discussed the consequences of untreated sleep apnea. Advised not to drive drowsy for safety of patient and others. Will follow up in 1 year.    HTN Stable, on current management. Following with PCP.    Patient  satisfied with Plan of action and management. All questions answered  I spent a total of 42 minutes  reviewing chart data, face-to-face evaluation with the patient, counseling and coordination of care as detailed above.    Margie Urbanowicz, M.D.  Sleep Medicine  [facility]

## 2024-01-09 NOTE — Patient Instructions (Signed)

## 2024-03-23 NOTE — Progress Notes (Unsigned)
 Electrophysiology Clinic Note    Date:  03/24/2024  Patient ID:  Allex, Lapoint [birth year], MRN [id number] PCP:  Martin Damien Maier, PA  Cardiologist:  Martin Lunger, MD  Electrophysiologist:  Martin Sage, MD (Inactive)  Electrophysiology APP:  Martin Kyler, NP    Discussed the use of AI scribe software for clinical note transcription with the patient, who gave verbal consent to proceed.   Patient Profile    Chief Complaint: NSVT follow-up  History of Present Illness: Martin Johnson is a 58 y.o. male with PMH notable for NSVT, OSA on CPAP, HFpEF ; seen today for Martin Sage, MD (Inactive) for routine electrophysiology followup.   I last saw him 08/2023 where he c/o forearm hair thinning and increase bruising. Also having ED, but with significant improvement in NSVT, no plans to stop BB.  On follow-up today, he has not experienced recent episodes of palpitations, chest pain, or chest pressure. He takes atenolol  without significant changes in symptoms. He engages in regular physical activity, walking for about thirty minutes each morning, and feels fine during these walks.  He uses a CPAP machine for sleep apnea most nights, although occasionally does not put it back on after waking up during the night. He is trying to get more rest by going to bed earlier, around 8 PM, as he wakes up at 3 AM for work.       Arrhythmia/Device History No specialty comments available.    ROS:  Please see the history of present illness. All other systems are reviewed and otherwise negative.    Physical Exam    VS:  BP 110/64 (BP Location: Left Arm, Patient Position: Sitting, Cuff Size: Large)   Pulse 68   Ht 5' 8 (1.727 m)   Wt 262 lb (118.8 kg)   SpO2 97%   BMI 39.84 kg/m  BMI: Body mass index is 39.84 kg/m.           Wt Readings from Last 3 Encounters:  03/24/24 262 lb (118.8 kg)  01/09/24 260 lb (117.9 kg)  08/09/23 278 lb 4 oz (126.2 kg)     GEN- The  patient is well appearing, alert and oriented x 3 today.   Lungs- Clear to ausculation bilaterally, normal work of breathing.  Heart- Regular rate and rhythm, no murmurs, rubs or gallops Extremities- No peripheral edema, warm, dry   Studies Reviewed   Previous EP, cardiology notes.    EKG is ordered. Personal review of EKG from today shows:    EKG Interpretation Date/Time:  Tuesday March 24 2024 12:54:23 EDT Ventricular Rate:  70 PR Interval:  186 QRS Duration:  164 QT Interval:  444 QTC Calculation: 479 R Axis:   -45  Text Interpretation: Normal sinus rhythm Left axis deviation Right bundle branch block Confirmed by Martin Johnson [id number]) on 03/24/2024 1:00:50 PM    Long term monitor, 12/24/2022 Patient had a min HR of 36 bpm, max HR of 104 bpm, and avg HR of 62 bpm. Predominant underlying rhythm was Sinus Rhythm. Bundle Branch Block/IVCD was present. 8 Pauses occurred, the longest lasting 4.4 secs (13 bpm). Isolated SVEs were rare (<1.0%), and no SVE Couplets or SVE Triplets were present. Isolated VEs were rare (<1.0%), VE Couplets were rare (<1.0%), and no VE Triplets were present.   Impression 1-insignificant ventricular tachycardia 2-pauses-nocturnal possibly associated with sleep apnea>> clinical correlation recommended regarding the need for sleep study   TTE, 08/10/2022  1. Left ventricular ejection fraction,  by estimation, is 60 to 65%. The left ventricle has normal function. The left ventricle has no regional wall motion abnormalities. Left ventricular diastolic parameters are consistent with Grade I diastolic dysfunction (impaired relaxation).   2. Right ventricular systolic function is normal. The right ventricular size is normal.   3. The mitral valve is normal in structure. No evidence of mitral valve regurgitation. No evidence of mitral stenosis.   4. The aortic valve has an indeterminant number of cusps, unable to exclude bicuspid aortic valve (not well visualized).  Aortic valve  regurgitation is not visualized. No aortic stenosis is present.   5. The inferior vena cava is normal in size with greater than 50% respiratory variability, suggesting right atrial pressure of 3 mmHg.    Long term monitor, 06/25/2022 Normal sinus rhythm Patient had a min HR of 43 bpm, max HR of 235 bpm, and avg HR of 83 bpm. Bundle Branch Block/IVCD was present.   100 Ventricular Tachycardia runs occurred, the run with the fastest interval lasting 6 beats with a max rate of 235 bpm, the longest lasting 15 beats with an avg rate of 157 bpm.   3 Pauses occurred, the longest lasting 3.6 secs (17 bpm).  All pauses occurring overnight midnight to 6 AM  Isolated SVEs were rare (<1.0%), SVE Couplets were rare (<1.0%), and SVE Triplets were rare (<1.0%).  Isolated VEs were rare (<1.0%, 930), VE Couplets were rare (<1.0%, 416), and VE Triplets were rare (<1.0%, 43).  Ventricular Bigeminy and Trigeminy were present.  Triggered events (2) associated with normal sinus rhythm   Assessment and Plan     #) NSVT #) RBBB Very low burden by symptoms Stable EKG Update 1 week zio monitor to confirm low NSVT burden Continue 50mg  atenolol  BID   #) HTN Well-controlled on current regimen   #) OSA on CPAP Encouraged nightly CPAP compliance He follows regularly with pulm       Current medicines are reviewed at length with the patient today.   The patient does not have concerns regarding his medicines.  The following changes were made today:  none  Labs/ tests ordered today include:  Orders Placed This Encounter  Procedures   LONG TERM MONITOR (3-14 DAYS)   EKG 12-Lead     Disposition: Follow up with Dr. Kennyth or EP APP based on zio results    Follow-up with general cardiology team - Dr. Gollan or APP in 1 year   Signed, Martin Needle, NP  03/24/24  1:27 PM  Electrophysiology [facility]

## 2024-03-24 ENCOUNTER — Ambulatory Visit (INDEPENDENT_AMBULATORY_CARE_PROVIDER_SITE_OTHER)

## 2024-03-24 ENCOUNTER — Ambulatory Visit: Attending: Cardiology | Admitting: Cardiology

## 2024-03-24 VITALS — BP 110/64 | HR 68 | Ht 68.0 in | Wt 262.0 lb

## 2024-03-24 DIAGNOSIS — I4729 Other ventricular tachycardia: Secondary | ICD-10-CM

## 2024-03-24 DIAGNOSIS — I10 Essential (primary) hypertension: Secondary | ICD-10-CM | POA: Diagnosis not present

## 2024-03-24 DIAGNOSIS — G4733 Obstructive sleep apnea (adult) (pediatric): Secondary | ICD-10-CM | POA: Diagnosis not present

## 2024-03-24 NOTE — Patient Instructions (Signed)
 Medication Instructions:  Your physician recommends that you continue on your current medications as directed. Please refer to the Current Medication list given to you today.   *If you need a refill on your cardiac medications before your next appointment, please call your pharmacy*  Lab Work: No labs ordered today  If you have labs (blood work) drawn today and your tests are completely normal, you will receive your results only by: MyChart Message (if you have MyChart) OR A paper copy in the mail If you have any lab test that is abnormal or we need to change your treatment, we will call you to review the results.  Testing/Procedures: Martin Johnson- Long Term Monitor Instructions  Your physician has requested you wear a ZIO patch monitor for 7 days.  This is a single patch monitor. [employer] supplies one patch monitor per enrollment. Additional stickers are not available. Please do not apply patch if you will be having a Nuclear Stress Test, Echocardiogram, Cardiac CT, MRI, or Chest Xray during the period you would be wearing the monitor. The patch cannot be worn during these tests. You cannot remove and re-apply the ZIO XT patch monitor.  Your ZIO patch monitor will be mailed 3 day USPS to your address on file. It may take 3-5 days to receive your monitor after you have been enrolled. Once you have received your monitor, please review the enclosed instructions. Your monitor has already been registered assigning a specific monitor serial number to you.  Billing and Patient Assistance Program Information  We have supplied [employer] with any of your insurance information on file for billing purposes.  [employer] offers a sliding scale Patient Assistance Program for patients that do not have insurance, or whose insurance does not completely cover the cost of the ZIO monitor.  You must apply for the Patient Assistance Program to qualify for this discounted rate.  To apply, please call [employer] at [phone],  select option 4, select option 2, ask to apply for Patient Assistance Program. Meredeth will ask your household income, and how many people are in your household. They will quote your out-of-pocket cost based on that information. [employer] will also be able to set up a 36-month, interest-free payment plan if needed.  Applying the monitor   Shave hair from upper left chest.  Hold abrader disc by orange tab. Rub abrader in 40 strokes over the upper left chest as indicated in your monitor instructions.  Clean area with 4 enclosed alcohol pads. Let dry.  Apply patch as indicated in monitor instructions. Patch will be placed under collarbone on left side of chest with arrow pointing upward.  Rub patch adhesive wings for 2 minutes. Remove white label marked 1. Remove the white label marked 2. Rub patch adhesive wings for 2 additional minutes.  While looking in a mirror, press and release button in center of patch. A small green light will flash 3-4 times. This will be your only indicator that the monitor has been turned on.  Do not shower for the first 24 hours. You may shower after the first 24 hours.  Press the button if you feel a symptom. You will hear a small click. Record Date, Time and Symptom in the Patient Logbook.  When you are ready to remove the patch, follow instructions on the last 2 pages of Patient Logbook.  Stick patch monitor into the tabs at the bottom of the return box.  Place Patient Logbook in the blue and white box. Use locking tab  on box and tape box closed securely. The blue and white box has prepaid postage on it. Please place it in the mailbox as soon as possible. Your physician should have your test results approximately 7-14 days after the monitor has been mailed back to [facility].  Call [facility] Customer Care at [phone] if you have questions regarding your ZIO XT patch monitor.  Call them immediately if you see an orange light blinking on your monitor.  If  your monitor falls off in less than 4 days, contact our Monitor department at [phone].  If your monitor becomes loose or falls off after 4 days call [employer] at [phone] for suggestions on securing your monitor.   Follow-Up: At [facility], you and your health needs are our priority.  As part of our continuing mission to provide you with exceptional heart care, our providers are all part of one team.  This team includes your primary Cardiologist (physician) and Advanced Practice Providers or APPs (Physician Assistants and Nurse Practitioners) who all work together to provide you with the care you need, when you need it.  Your next appointment:   As needed with electrophysiology 1 year follow up with general cardiology   Provider:   Timothy Gollan, MD or Cadence Furth, PA-C    We recommend signing up for the patient portal called MyChart.  Sign up information is provided on this After Visit Summary.  MyChart is used to connect with patients for Virtual Visits (Telemedicine).  Patients are able to view lab/test results, encounter notes, upcoming appointments, etc.  Non-urgent messages can be sent to your provider as well.   To learn more about what you can do with MyChart, go to [email or web address].

## 2024-05-04 DIAGNOSIS — I4729 Other ventricular tachycardia: Secondary | ICD-10-CM

## 2024-05-05 ENCOUNTER — Ambulatory Visit: Payer: Self-pay | Admitting: Cardiology
